# Patient Record
Sex: Female | Born: 1975
Health system: Southern US, Community
[De-identification: ages and names within clinical notes are randomized; demographics above are authoritative.]

## PROBLEM LIST (undated history)

## (undated) ENCOUNTER — Inpatient Hospital Stay (HOSPITAL_COMMUNITY): Admission: RE | Payer: Self-pay | Source: Ambulatory Visit

## (undated) DIAGNOSIS — K5900 Constipation, unspecified: Secondary | ICD-10-CM

## (undated) DIAGNOSIS — M1990 Unspecified osteoarthritis, unspecified site: Secondary | ICD-10-CM

## (undated) DIAGNOSIS — J40 Bronchitis, not specified as acute or chronic: Secondary | ICD-10-CM

## (undated) DIAGNOSIS — M549 Dorsalgia, unspecified: Secondary | ICD-10-CM

## (undated) DIAGNOSIS — R102 Pelvic and perineal pain: Secondary | ICD-10-CM

## (undated) DIAGNOSIS — Z975 Presence of (intrauterine) contraceptive device: Secondary | ICD-10-CM

## (undated) DIAGNOSIS — E271 Primary adrenocortical insufficiency: Secondary | ICD-10-CM

## (undated) DIAGNOSIS — N83201 Unspecified ovarian cyst, right side: Secondary | ICD-10-CM

## (undated) DIAGNOSIS — G43909 Migraine, unspecified, not intractable, without status migrainosus: Secondary | ICD-10-CM

## (undated) HISTORY — DX: Constipation, unspecified: K59.00

## (undated) HISTORY — PX: TUBAL LIGATION: SHX77

## (undated) HISTORY — PX: BREAST BIOPSY: SHX20

## (undated) HISTORY — DX: Primary adrenocortical insufficiency: E27.1

## (undated) HISTORY — PX: OVARIAN CYST REMOVAL: SHX89

## (undated) HISTORY — DX: Bronchitis, not specified as acute or chronic: J40

## (undated) HISTORY — PX: ECTOPIC PREGNANCY SURGERY: SHX613

## (undated) HISTORY — DX: Dorsalgia, unspecified: M54.9

## (undated) HISTORY — DX: Unspecified ovarian cyst, right side: N83.201

## (undated) HISTORY — PX: CHOLECYSTECTOMY: SHX55

## (undated) HISTORY — DX: Migraine, unspecified, not intractable, without status migrainosus: G43.909

## (undated) HISTORY — DX: Presence of (intrauterine) contraceptive device: Z97.5

## (undated) HISTORY — DX: Unspecified osteoarthritis, unspecified site: M19.90

## (undated) HISTORY — DX: Pelvic and perineal pain: R10.2

---

## 2000-02-23 ENCOUNTER — Encounter: Payer: Self-pay | Admitting: Orthopedic Surgery

## 2000-02-23 ENCOUNTER — Emergency Department (HOSPITAL_COMMUNITY): Admission: EM | Admit: 2000-02-23 | Discharge: 2000-02-23 | Payer: Self-pay | Admitting: Internal Medicine

## 2000-07-06 ENCOUNTER — Ambulatory Visit (HOSPITAL_COMMUNITY): Admission: RE | Admit: 2000-07-06 | Discharge: 2000-07-06 | Payer: Self-pay | Admitting: Neurosurgery

## 2000-07-06 ENCOUNTER — Encounter: Payer: Self-pay | Admitting: Neurosurgery

## 2000-07-19 ENCOUNTER — Emergency Department (HOSPITAL_COMMUNITY): Admission: EM | Admit: 2000-07-19 | Discharge: 2000-07-19 | Payer: Self-pay | Admitting: Internal Medicine

## 2000-08-23 ENCOUNTER — Encounter: Payer: Self-pay | Admitting: Urology

## 2000-08-23 ENCOUNTER — Encounter: Admission: RE | Admit: 2000-08-23 | Discharge: 2000-08-23 | Payer: Self-pay | Admitting: Urology

## 2001-03-19 ENCOUNTER — Inpatient Hospital Stay (HOSPITAL_COMMUNITY): Admission: AD | Admit: 2001-03-19 | Discharge: 2001-03-20 | Payer: Self-pay | Admitting: Internal Medicine

## 2001-03-25 ENCOUNTER — Encounter: Payer: Self-pay | Admitting: Internal Medicine

## 2001-03-25 ENCOUNTER — Ambulatory Visit (HOSPITAL_COMMUNITY): Admission: RE | Admit: 2001-03-25 | Discharge: 2001-03-25 | Payer: Self-pay | Admitting: Internal Medicine

## 2001-04-01 ENCOUNTER — Other Ambulatory Visit: Admission: RE | Admit: 2001-04-01 | Discharge: 2001-04-01 | Payer: Self-pay | Admitting: Obstetrics and Gynecology

## 2001-07-24 ENCOUNTER — Ambulatory Visit (HOSPITAL_COMMUNITY): Admission: RE | Admit: 2001-07-24 | Discharge: 2001-07-24 | Payer: Self-pay | Admitting: Family Medicine

## 2001-07-24 ENCOUNTER — Encounter: Payer: Self-pay | Admitting: Family Medicine

## 2001-09-09 ENCOUNTER — Emergency Department (HOSPITAL_COMMUNITY): Admission: EM | Admit: 2001-09-09 | Discharge: 2001-09-09 | Payer: Self-pay | Admitting: Emergency Medicine

## 2001-10-01 ENCOUNTER — Encounter: Payer: Self-pay | Admitting: Obstetrics and Gynecology

## 2001-10-01 ENCOUNTER — Ambulatory Visit (HOSPITAL_COMMUNITY): Admission: RE | Admit: 2001-10-01 | Discharge: 2001-10-01 | Payer: Self-pay | Admitting: Obstetrics and Gynecology

## 2001-10-24 ENCOUNTER — Ambulatory Visit (HOSPITAL_COMMUNITY): Admission: RE | Admit: 2001-10-24 | Discharge: 2001-10-24 | Payer: Self-pay | Admitting: Obstetrics and Gynecology

## 2002-11-07 ENCOUNTER — Ambulatory Visit (HOSPITAL_COMMUNITY): Admission: AD | Admit: 2002-11-07 | Discharge: 2002-11-07 | Payer: Self-pay | Admitting: Obstetrics and Gynecology

## 2002-11-17 ENCOUNTER — Ambulatory Visit (HOSPITAL_COMMUNITY): Admission: AD | Admit: 2002-11-17 | Discharge: 2002-11-18 | Payer: Self-pay | Admitting: Obstetrics and Gynecology

## 2002-11-30 ENCOUNTER — Inpatient Hospital Stay (HOSPITAL_COMMUNITY): Admission: AD | Admit: 2002-11-30 | Discharge: 2002-12-03 | Payer: Self-pay | Admitting: Obstetrics and Gynecology

## 2003-11-20 ENCOUNTER — Ambulatory Visit (HOSPITAL_COMMUNITY): Admission: RE | Admit: 2003-11-20 | Discharge: 2003-11-20 | Payer: Self-pay | Admitting: Internal Medicine

## 2004-01-13 ENCOUNTER — Ambulatory Visit: Payer: Self-pay | Admitting: Internal Medicine

## 2004-02-17 ENCOUNTER — Encounter (HOSPITAL_COMMUNITY): Admission: RE | Admit: 2004-02-17 | Discharge: 2004-03-18 | Payer: Self-pay | Admitting: Neurosurgery

## 2004-04-01 ENCOUNTER — Encounter (HOSPITAL_COMMUNITY): Admission: RE | Admit: 2004-04-01 | Discharge: 2004-05-01 | Payer: Self-pay | Admitting: Neurosurgery

## 2004-07-20 ENCOUNTER — Encounter (HOSPITAL_COMMUNITY): Admission: RE | Admit: 2004-07-20 | Discharge: 2004-08-19 | Payer: Self-pay | Admitting: Neurosurgery

## 2006-12-10 ENCOUNTER — Other Ambulatory Visit: Admission: RE | Admit: 2006-12-10 | Discharge: 2006-12-10 | Payer: Self-pay | Admitting: Obstetrics and Gynecology

## 2007-04-05 ENCOUNTER — Ambulatory Visit (HOSPITAL_COMMUNITY): Admission: RE | Admit: 2007-04-05 | Discharge: 2007-04-05 | Payer: Self-pay | Admitting: Internal Medicine

## 2007-12-16 ENCOUNTER — Other Ambulatory Visit: Admission: RE | Admit: 2007-12-16 | Discharge: 2007-12-16 | Payer: Self-pay | Admitting: Obstetrics and Gynecology

## 2007-12-22 ENCOUNTER — Emergency Department (HOSPITAL_COMMUNITY): Admission: EM | Admit: 2007-12-22 | Discharge: 2007-12-23 | Payer: Self-pay | Admitting: Emergency Medicine

## 2007-12-27 ENCOUNTER — Ambulatory Visit (HOSPITAL_COMMUNITY): Admission: RE | Admit: 2007-12-27 | Discharge: 2007-12-27 | Payer: Self-pay | Admitting: Internal Medicine

## 2008-08-11 ENCOUNTER — Encounter: Admission: RE | Admit: 2008-08-11 | Discharge: 2008-08-11 | Payer: Self-pay | Admitting: Endocrinology

## 2008-12-08 ENCOUNTER — Ambulatory Visit (HOSPITAL_COMMUNITY): Admission: RE | Admit: 2008-12-08 | Discharge: 2008-12-08 | Payer: Self-pay | Admitting: Family Medicine

## 2008-12-28 ENCOUNTER — Encounter (HOSPITAL_COMMUNITY): Admission: RE | Admit: 2008-12-28 | Discharge: 2009-01-27 | Payer: Self-pay | Admitting: Internal Medicine

## 2009-01-27 ENCOUNTER — Other Ambulatory Visit: Admission: RE | Admit: 2009-01-27 | Discharge: 2009-01-27 | Payer: Self-pay | Admitting: Obstetrics and Gynecology

## 2009-01-28 ENCOUNTER — Ambulatory Visit (HOSPITAL_COMMUNITY): Admission: RE | Admit: 2009-01-28 | Discharge: 2009-01-28 | Payer: Self-pay | Admitting: Obstetrics & Gynecology

## 2009-07-09 ENCOUNTER — Encounter: Admission: RE | Admit: 2009-07-09 | Discharge: 2009-07-09 | Payer: Self-pay | Admitting: Neurosurgery

## 2009-11-24 ENCOUNTER — Encounter: Admission: RE | Admit: 2009-11-24 | Discharge: 2009-11-24 | Payer: Self-pay | Admitting: Neurosurgery

## 2009-12-07 ENCOUNTER — Ambulatory Visit (HOSPITAL_COMMUNITY): Admission: RE | Admit: 2009-12-07 | Discharge: 2009-12-07 | Payer: Self-pay | Admitting: Cardiology

## 2009-12-07 ENCOUNTER — Encounter (INDEPENDENT_AMBULATORY_CARE_PROVIDER_SITE_OTHER): Payer: Self-pay | Admitting: Cardiology

## 2010-03-20 ENCOUNTER — Encounter: Payer: Self-pay | Admitting: Orthopedic Surgery

## 2010-05-18 ENCOUNTER — Emergency Department (HOSPITAL_COMMUNITY)
Admission: EM | Admit: 2010-05-18 | Discharge: 2010-05-18 | Disposition: A | Discharge status: Home / Self Care | Payer: Managed Care, Other (non HMO) | Attending: Emergency Medicine | Admitting: Emergency Medicine

## 2010-05-18 DIAGNOSIS — M67919 Unspecified disorder of synovium and tendon, unspecified shoulder: Secondary | ICD-10-CM | POA: Insufficient documentation

## 2010-05-18 DIAGNOSIS — M719 Bursopathy, unspecified: Secondary | ICD-10-CM | POA: Insufficient documentation

## 2010-05-18 DIAGNOSIS — M25519 Pain in unspecified shoulder: Secondary | ICD-10-CM | POA: Insufficient documentation

## 2010-07-15 NOTE — H&P (Signed)
NAME:  Gloria Acosta, Gloria Acosta NO.:  [id number]   MEDICAL RECORD NO.:  [id number]                   PATIENT TYPE:   LOCATION:                                       FACILITY:   PHYSICIAN:  Tilda Burrow, M.D.              DATE OF BIRTH:   DATE OF ADMISSION:  11/30/2002  DATE OF DISCHARGE:                                HISTORY & PHYSICAL   ADMISSION DIAGNOSIS:  Pregnancy at [redacted] weeks gestation for elective induction  scheduled for November 30, 2002.   HISTORY OF PRESENT ILLNESS:  This 35 year old female, gravida 2, para 0, AB  1 (ectopic), LMP 02/10/2002, placing menstrual EDC November 18, 2002,  with  ultrasound revised [facility] of November 30, 2002, based on a 21-week ultrasound  with a gestational sac suggesting EDC of December 08, 2002.  The patient is  39 to 40 weeks by criteria and requested induction for scheduling purposes  and for relief of chronic pressure.  The patient understands that all the  usual complications of labor can occur with induced labors.  The patient  plans for epidural to be administered early in labor, plans to breast feed  with bottle feed supplementation.   PAST MEDICAL HISTORY:  1. Anxiety.  2. Migraines.   PAST SURGICAL HISTORY:  1. History of pelvic adhesions.  2. Laparoscopic cholecystectomy in 1995.  3. Right salpingectomy by Jannifer Franklin, M.D. for hydrosalpinx in 2002.  4. Ectopic pregnancy in 1997 involving the right tube.   ALLERGIES:  PENICILLIN, SULFA, and CODEINE.   HABITS:  Cigarettes 4 per day.  Alcohol and recreational drugs denied.   SOCIAL HISTORY:  Married, employed.   PHYSICAL EXAMINATION:  GENERAL:  Cheerful, fatigued-appearing Caucasian  female, alert and oriented x e.  HEENT:  Pupils are equal, round, and reactive to light.  Extraocular  movements intact.  NECK:  Supple.  Trachea midline.  CHEST:  Clear to auscultation.  ABDOMEN:  Nontender. Gravid uterus, 40 cm.  Estimated fetal weight 8 pounds.  Cervix 1 cm, 20%, -2, and posterior with presenting part well applied to  lower uterine segment.    PRENATAL LABORATORY DATA:  Blood type O positive, rubella immune at present,  antibody screen negative.  Hemoglobin 13, hematocrit 39.  Hepatitis, HIV,  GC, Chlamydia, and RPR are all negative.  Pap smear Class I.  Glucose  tolerance 160 mg percent.   PLAN:  Balloon dilation Sunday night.  Pitocin induction in a.m. with  epidural scheduled for December 01, 2002.     ___________________________________________                                         Tilda Burrow, M.D.   JVF/MEDQ  D:  11/26/2002  T:  11/26/2002  Job:  [id number]   cc:  Francoise Schaumann. Halm, D.O.  [address], [address]  [place]  [place] [address]  Fax: [phone]

## 2010-07-15 NOTE — Discharge Summary (Signed)
[facility]  Patient:    ANAYELI, AREL Visit Number: [id number] MRN: [id number]          Service Type: OUT Location: RAD Attending Physician:  Malissa Hippo Dictated by:   Elfredia Nevins, M.D. Adm. Date:  03/19/01 Disc. Date: 03/20/01                             Discharge Summary  DISCHARGE DIAGNOSIS:  Pyelonephritis.  DISCHARGE MEDICATIONS: 1. Levaquin 500 mg p.o. q.d. 2. Tylenol p.r.n. alternating with Darvocet N100 p.o. q.i.d. p.r.n.  PAST MEDICAL HISTORY:  Pertinent for migraine headaches and status post laparoscopic cholecystectomy.  SUMMARY:  The patient was admitted on March 19, 2001, with abdominal pain, nausea, and myalgias. She was noted to have fever, chills, without true rigors. She was found to have urinary tract infection and a negative pregnancy test. She had GI symptoms that were severe enough to warrant admission, IV hydration, IV antibiotics. She responded well to interventions, followup in office. Dictated by:   Elfredia Nevins, M.D. Attending Physician:  Malissa Hippo DD:  04/27/01 TD:  04/28/01 Job: [id number]

## 2010-07-15 NOTE — Discharge Summary (Signed)
   NAME:  Gloria Acosta, Gloria Acosta                         ACCOUNT NO.:  [id number]   MEDICAL RECORD NO.:  [id number]                   PATIENT TYPE:  INP   LOCATION:  A418                                 FACILITY:  [facility]   PHYSICIAN:  Tilda Burrow, M.D.              DATE OF BIRTH:  April 12, 1975   DATE OF ADMISSION:  11/30/2002  DATE OF DISCHARGE:  12/03/2002                                 DISCHARGE SUMMARY   ADDITIONAL DIAGNOSES:  Pregnancy at 39 weeks, elective induction.   DISCHARGE DIAGNOSES:  Pregnancy at 39 weeks, delivered, failed medical  induction, cephalopelvic disproportion.   PROCEDURE:  1. Primary low transverse cervical cesarean section on December 01, 2002.  2. Lumbar epidural catheter, Tilda Burrow, M.D.   DISCHARGE MEDICATIONS:  1. Tylox 1-2 q.4h. p.r.n. pain, dispensed 20.  2. Motrin 200 mg two q.4h. p.r.n. pain.  3. Mylicon p.r.n. gas.   FOLLOWUP:  Follow up in five days for staple removal.   HOSPITAL SUMMARY:  This 35 year old female gravida 2, para 0 is admitted for  induction at 39 weeks after a pregnancy course notable for estimated fetal  weight of 8 pounds with cervix 1 cm, 20%, -2.  Very posterior with well  applied low uterine segment.  The patient requested elective induction and  accepted the usual risks associated with labor management.   HOSPITAL COURSE:  The patient was admitted on the evening of November 30, 2002, underwent balloon dilation of the cervix overnight which allowed the  cervix to go to 1-2 cm vertex, -2 station.  She received lumbar epidural.  She proceeded with epidural analgesia which worked with excellent relief.  She had arrestive labor at 4 cm, 90%, -1 with a very molded vertex and a  prominent ischial spine that obstructed the progress of the vertex.  Cephalopelvic disproportion was felt to be diagnosed.  The patient went to  cesarean section delivering a 9 pound 9 ounce female with Apgars of 9 and 9,  cared for by Dr. Milford Cage.   Postpartum hemoglobin 9.4, hematocrit 27.8 compared to admitting hemoglobin  11.1, hematocrit 33.0.  Maternal blood type confirmed as O positive.   Mother will be followed up in five days for staple removal.  She plans to  bottle feed.  May reattempt breast feeding, is not certain.  She will be  discharged today after efforts at being taught to pump.  Follow up in five  days then four weeks.     ___________________________________________                                         Tilda Burrow, M.D.   JVF/MEDQ  D:  12/03/2002  T:  12/03/2002  Job:  [id number]

## 2010-07-15 NOTE — H&P (Signed)
NAME:  Gloria Acosta, Gloria Acosta                         ACCOUNT NO.:  [id number]   MEDICAL RECORD NO.:  [id number]                   PATIENT TYPE:  AMB   LOCATION:  DAY                                  FACILITY:  [facility]   PHYSICIAN:  Tilda Burrow, M.D.              DATE OF BIRTH:  31-Jul-1975   DATE OF ADMISSION:  DATE OF DISCHARGE:                                HISTORY & PHYSICAL   ADMITTING DIAGNOSES:  1. Infertility  2. Right hydrosalpinx.   HISTORY OF PRESENT ILLNESS:  This 35 year old female, gravida 1, para 0,  ectopic 1, status post right linear salpingostomy, is admitted at this time  for right salpingectomy.  The patient has been followed through our office  and in the year 2000, had a laparoscopic evaluation for pelvic pain prior to  beginning fertility workup.  Diagnostic laparoscopy revealed a few thin  adhesions with the uterus slightly asymmetric, deviated to the patient's  right side, with two small dense adhesions from the ovary to the right  sidewall and a right hydrosalpinx present with disruption of the distal one-  third of the right tube, status post previous ectopic surgery.  The patient  has since then been seen in our office for a fertility evaluation and has  had recent hysterosalpingogram which confirms that the attempts at  neosalpingostomy at the time of that 2001 surgery failed.  The presence of  hydrosalpinx continues.  Current discussion of case with Dr. Gaetano Hawthorne.  Lily Peer as well as Dr. Lazaro Arms indicates that current fertility  thinking is that chronic hydrosalpinx associated with a patent opposite tube  warrants removal of the tube.  The patient has had this explained and  desires to proceed with salpingectomy.  The patient was made completely  aware that there are no guarantees that subsequent fertility with the  remaining tube can be given.  Further documentation is planned to assist  with any subsequent reproductive techniques or referral.   PAST MEDICAL HISTORY:  Benign.   SURGICAL HISTORY:  Cholecystectomy in 1994, laparoscopically performed;  right linear salpingostomy in 1997 with lysis of pelvic adhesions, at which  time the left tube was found to be patent but phimosed; laparoscopic  evaluation for pelvic pain in 2001 revealing the previously mentioned  fimbria visible at the tip of the left tube; recent hysterosalpingogram  showing patency and peritoneal spill through the left tube.   ALLERGIES:  PENICILLIN AND SULFA.   SOCIAL HISTORY:  Works at [employer].   REVIEW OF SYSTEMS:  Review of systems is positive for recent sinus  congestion and mild fever.   PHYSICAL EXAMINATION:  VITAL SIGNS:  Height 5 feet 6 inches.  Weight is 142.  Blood pressure 130/80.   LABORATORY DATA:  Urinalysis shows 2+ blood and urine culture has returned  showing no evidence of nitrites or esterase, with only 10,000 Enterococci  species.   ASSESSMENT:  1. Right hydrosalpinx.  2. Primary infertility.   PLAN:  Laparoscopic right salpingectomy with further documentation of  patency of the left tube by tubal dye studies.                                                 Tilda Burrow, M.D.    JVF/MEDQ  D:  10/23/2001  T:  10/24/2001  Job:  [id number]

## 2010-07-15 NOTE — Op Note (Signed)
NAME:  Gloria Acosta, Gloria Acosta                         ACCOUNT NO.:  [id number]   MEDICAL RECORD NO.:  [id number]                   PATIENT TYPE:  INP   LOCATION:  A418                                 FACILITY:  [facility]   PHYSICIAN:  Tilda Burrow, M.D.              DATE OF BIRTH:  [birth year]   DATE OF PROCEDURE:  DATE OF DISCHARGE:                                 OPERATIVE REPORT   PREOPERATIVE DIAGNOSES:  Pregnancy 39 weeks, failed medical induction,  cephalopelvic disproportion.   POSTOPERATIVE DIAGNOSES:  Pregnancy 39 weeks, failed medical induction,  cephalopelvic disproportion.   PROCEDURE:  Primary low transverse cervical cesarean section.   SURGEON:  Tilda Burrow, M.D.   ASSISTANT:  Zerita Boers, C.N.M.   ANESTHESIA:  Epidural, Tilda Burrow, M.D. managed by Minerva Areola, CRNA.   COMPLICATIONS:  None.   FINDINGS:  Large framed 9 pound 9 ounce female infant, significant molding  of the vertex, no chance at vaginal delivery.   DESCRIPTION OF PROCEDURE:  The patient was taken to the operating room,  prepped and draped for lower abdominal surgery after epidural had been  topped up by Minerva Areola, CRNA.  The patient had excellent analgesic levels  and a Pfannenstiel type incision was performed approximately 12 cm in  length, with easy access through a thin fatty layer to the fascia which was  opened transversely and the standard Pfannenstiel technique used in opening  it. A bladder flap was developed on the lower uterine segment and then an  easy transverse incision made in the lower uterine segment at the level of  the infant's ear. The vertex could be gently dislodged from the pelvis where  it was snuggly stuck and then it was rotated into the incision. A vacuum  extractor was used to guide the vertex when combined with fundal pressure.  The infant's right arm was released first to reduce shoulder girdle size for  delivery and then the infant eased out from there.  The cord was clamped and  the infant taken by Ms. Hart Rochester to Dr. Milford Cage for further care, see his notes  for details. Apgar of 9&9 were assigned and weighted subsequently determined  to be 9 pounds and 9 ounces.   Placenta delivered [facility] presentation after cord blood samples obtained  with the results located elsewhere in the chart.   A single layer of running locking closure of the uterus was performed with  good hemostasis except at the left corner where a small hematoma was  developing from the uterine vessels. A single suture was necessary around  the uterine artery and vein on the left side to improve hemostasis. We then  proceeded with bladder flap closure and 2-0 Chromic. We then proceeded with  irrigation of the abdomen, 2-0 Chromic closure of the anterior peritoneum, 0  Chromic closure of the fascial layer. A 2-0 plain reapproximation of the  subcu fatty tissue  with three interrupted sutures and then staple closure of  the skin for completion of the procedure. The patient tolerated the  procedure well and went to the recovery room in good condition with 500 mL  blood loss.      ___________________________________________                                            Tilda Burrow, M.D.   JVF/MEDQ  D:  12/01/2002  T:  12/02/2002  Job:  [id number]

## 2010-07-15 NOTE — Op Note (Signed)
NAME:  Gloria Acosta, Gloria Acosta                         ACCOUNT NO.:  [id number]   MEDICAL RECORD NO.:  [id number]                   PATIENT TYPE:  AMB   LOCATION:  DAY                                  FACILITY:  [facility]   PHYSICIAN:  Tilda Burrow, M.D.              DATE OF BIRTH:  12-Feb-1976   DATE OF PROCEDURE:  10/24/2001  DATE OF DISCHARGE:  10/24/2001                                 OPERATIVE REPORT   PREOPERATIVE DIAGNOSIS:  Right hydrosalpinx, infertility.   POSTOPERATIVE DIAGNOSIS:  Right hydrosalpinx, infertility.   PROCEDURE:  Laparoscopic right salpingectomy.   SURGEON:  Tilda Burrow, M.D.   ASSISTANTCurley Spice   ANESTHESIA:  General endotracheal intubation   COMPLICATIONS:  None.   ESTIMATED BLOOD LOSS:  Minimal.   INDICATIONS:  This 35 year old female who has had a documented hydrosalpinx  in the past -- see HPI for details.   DESCRIPTION OF PROCEDURE:  The patient was prepped and draped for combined  abdominal and vaginal procedure with legs in lithotomy position using yellow-  fin leg supports.  Hulka tenaculum was attached to the cervix for uterine  manipulation and Foley catheter placed in the bladder.  The infraumbilical  vertical 1 cm skin incision with a transverse suprapubic 1 cm incision were  used to achieve pneumoperitoneum with Veress introduced through the  umbilicus with insufflation with 3 liters of CO2.  Patient had laparoscopic  trocar introduced into the umbilicus.  Visualization of the pelvic  structures showed no evidence of bleeding or trauma, and suprapubic trocar  introduced under direct visualization.   Attention was then directed to the pelvis whereupon the third trocar site  could be introduced into the right lower quadrant.  We then were able to  pick up the tube of the dilated hydrosalpinx tube and with a grasping  Babcock clamp, and the hydrosalpinx was then excised by using unipolar  cautery excising the tube from its attachment to  the uterus on one side, and  then cauterizing across just beneath the tube removing the entire tube  without difficulty.  The ovary was left alone.  Hemostasis was excellent.  The patient tolerated the procedure well.  Inspection of the pelvis was  performed and no additional adhesions encountered.  The opposite side  appeared to be in good shape. We then  proceeded with deflation of the abdomen instilling 500 cc of normal saline  into the abdomen, removal of laparoscopic equipment, and subcuticular 4-0  Dexon closure of the skin incisions after closure of the fascial incision  with #0 Vicryl.  The patient tolerated the procedure well and went to  recovery room in good condition.  Tilda Burrow, M.D.    JVF/MEDQ  D:  12/15/2001  T:  12/15/2001  Job:  [id number]

## 2010-07-18 ENCOUNTER — Other Ambulatory Visit: Payer: Self-pay | Admitting: Neurosurgery

## 2010-07-18 ENCOUNTER — Ambulatory Visit (HOSPITAL_COMMUNITY)
Admission: RE | Admit: 2010-07-18 | Discharge: 2010-07-18 | Disposition: A | Discharge status: Home / Self Care | Payer: Managed Care, Other (non HMO) | Source: Ambulatory Visit | Attending: Family Medicine | Admitting: Family Medicine

## 2010-07-18 ENCOUNTER — Other Ambulatory Visit (HOSPITAL_COMMUNITY): Payer: Self-pay | Admitting: Family Medicine

## 2010-07-18 DIAGNOSIS — M25579 Pain in unspecified ankle and joints of unspecified foot: Secondary | ICD-10-CM | POA: Insufficient documentation

## 2010-07-18 DIAGNOSIS — X58XXXA Exposure to other specified factors, initial encounter: Secondary | ICD-10-CM | POA: Insufficient documentation

## 2010-07-18 DIAGNOSIS — S8990XA Unspecified injury of unspecified lower leg, initial encounter: Secondary | ICD-10-CM | POA: Insufficient documentation

## 2010-07-18 DIAGNOSIS — M47816 Spondylosis without myelopathy or radiculopathy, lumbar region: Secondary | ICD-10-CM

## 2010-07-29 ENCOUNTER — Other Ambulatory Visit: Payer: Managed Care, Other (non HMO)

## 2010-08-16 ENCOUNTER — Other Ambulatory Visit: Payer: Self-pay | Admitting: Neurosurgery

## 2010-08-16 DIAGNOSIS — M47816 Spondylosis without myelopathy or radiculopathy, lumbar region: Secondary | ICD-10-CM

## 2010-08-19 ENCOUNTER — Ambulatory Visit
Admission: RE | Admit: 2010-08-19 | Discharge: 2010-08-19 | Disposition: A | Discharge status: Home / Self Care | Payer: Managed Care, Other (non HMO) | Source: Ambulatory Visit | Attending: Neurosurgery | Admitting: Neurosurgery

## 2010-08-19 DIAGNOSIS — M47816 Spondylosis without myelopathy or radiculopathy, lumbar region: Secondary | ICD-10-CM

## 2010-09-30 ENCOUNTER — Other Ambulatory Visit: Payer: Self-pay | Admitting: Neurosurgery

## 2010-09-30 DIAGNOSIS — M47816 Spondylosis without myelopathy or radiculopathy, lumbar region: Secondary | ICD-10-CM

## 2010-10-07 ENCOUNTER — Ambulatory Visit
Admission: RE | Admit: 2010-10-07 | Discharge: 2010-10-07 | Disposition: A | Discharge status: Home / Self Care | Payer: Managed Care, Other (non HMO) | Source: Ambulatory Visit | Attending: Neurosurgery | Admitting: Neurosurgery

## 2010-10-07 VITALS — BP 113/71 | HR 77

## 2010-10-07 DIAGNOSIS — M47816 Spondylosis without myelopathy or radiculopathy, lumbar region: Secondary | ICD-10-CM

## 2010-10-07 MED ORDER — METHYLPREDNISOLONE ACETATE 40 MG/ML INJ SUSP (RADIOLOG
120.0000 mg | Freq: Once | INTRAMUSCULAR | Status: AC
  Administered 2010-10-07: 120 mg via EPIDURAL

## 2010-10-07 MED ORDER — IOHEXOL 180 MG/ML  SOLN
1.0000 mL | Freq: Once | INTRAMUSCULAR | Status: AC | PRN
  Administered 2010-10-07: 1 mL via EPIDURAL

## 2010-11-08 ENCOUNTER — Other Ambulatory Visit (HOSPITAL_COMMUNITY): Payer: Self-pay | Admitting: Internal Medicine

## 2010-11-08 DIAGNOSIS — Z139 Encounter for screening, unspecified: Secondary | ICD-10-CM

## 2010-11-10 ENCOUNTER — Ambulatory Visit (HOSPITAL_COMMUNITY)
Admission: RE | Admit: 2010-11-10 | Discharge: 2010-11-10 | Disposition: A | Discharge status: Home / Self Care | Payer: Managed Care, Other (non HMO) | Source: Ambulatory Visit | Attending: Internal Medicine | Admitting: Internal Medicine

## 2010-11-10 DIAGNOSIS — Z139 Encounter for screening, unspecified: Secondary | ICD-10-CM

## 2010-11-10 DIAGNOSIS — Z1231 Encounter for screening mammogram for malignant neoplasm of breast: Secondary | ICD-10-CM | POA: Insufficient documentation

## 2010-11-15 ENCOUNTER — Other Ambulatory Visit: Payer: Self-pay | Admitting: Neurosurgery

## 2010-11-15 ENCOUNTER — Other Ambulatory Visit (HOSPITAL_COMMUNITY): Payer: Self-pay | Admitting: Neurosurgery

## 2010-11-15 DIAGNOSIS — IMO0002 Reserved for concepts with insufficient information to code with codable children: Secondary | ICD-10-CM

## 2010-11-15 DIAGNOSIS — M519 Unspecified thoracic, thoracolumbar and lumbosacral intervertebral disc disorder: Secondary | ICD-10-CM

## 2010-11-15 DIAGNOSIS — M47816 Spondylosis without myelopathy or radiculopathy, lumbar region: Secondary | ICD-10-CM

## 2010-11-18 ENCOUNTER — Ambulatory Visit (HOSPITAL_COMMUNITY)
Admission: RE | Admit: 2010-11-18 | Discharge: 2010-11-18 | Disposition: A | Discharge status: Home / Self Care | Payer: Managed Care, Other (non HMO) | Source: Ambulatory Visit | Attending: Neurosurgery | Admitting: Neurosurgery

## 2010-11-18 DIAGNOSIS — M51379 Other intervertebral disc degeneration, lumbosacral region without mention of lumbar back pain or lower extremity pain: Secondary | ICD-10-CM | POA: Insufficient documentation

## 2010-11-18 DIAGNOSIS — M545 Low back pain, unspecified: Secondary | ICD-10-CM | POA: Insufficient documentation

## 2010-11-18 DIAGNOSIS — M79609 Pain in unspecified limb: Secondary | ICD-10-CM | POA: Insufficient documentation

## 2010-11-18 DIAGNOSIS — R209 Unspecified disturbances of skin sensation: Secondary | ICD-10-CM | POA: Insufficient documentation

## 2010-11-18 DIAGNOSIS — M5137 Other intervertebral disc degeneration, lumbosacral region: Secondary | ICD-10-CM | POA: Insufficient documentation

## 2010-11-18 DIAGNOSIS — IMO0002 Reserved for concepts with insufficient information to code with codable children: Secondary | ICD-10-CM

## 2011-02-13 ENCOUNTER — Other Ambulatory Visit (HOSPITAL_COMMUNITY)
Admission: RE | Admit: 2011-02-13 | Discharge: 2011-02-13 | Disposition: A | Discharge status: Home / Self Care | Payer: Managed Care, Other (non HMO) | Source: Ambulatory Visit | Attending: Obstetrics and Gynecology | Admitting: Obstetrics and Gynecology

## 2011-02-13 ENCOUNTER — Other Ambulatory Visit: Payer: Self-pay | Admitting: Adult Health

## 2011-02-13 DIAGNOSIS — Z01419 Encounter for gynecological examination (general) (routine) without abnormal findings: Secondary | ICD-10-CM | POA: Insufficient documentation

## 2011-11-13 ENCOUNTER — Other Ambulatory Visit (HOSPITAL_COMMUNITY): Payer: Self-pay | Admitting: Internal Medicine

## 2011-11-13 DIAGNOSIS — Z139 Encounter for screening, unspecified: Secondary | ICD-10-CM

## 2011-11-20 ENCOUNTER — Ambulatory Visit (HOSPITAL_COMMUNITY)
Admission: RE | Admit: 2011-11-20 | Discharge: 2011-11-20 | Disposition: A | Discharge status: Home / Self Care | Payer: BC Managed Care – PPO | Source: Ambulatory Visit | Attending: Internal Medicine | Admitting: Internal Medicine

## 2011-11-20 DIAGNOSIS — Z1231 Encounter for screening mammogram for malignant neoplasm of breast: Secondary | ICD-10-CM | POA: Insufficient documentation

## 2011-11-20 DIAGNOSIS — Z139 Encounter for screening, unspecified: Secondary | ICD-10-CM

## 2012-03-11 ENCOUNTER — Other Ambulatory Visit: Payer: Self-pay | Admitting: Adult Health

## 2012-03-11 ENCOUNTER — Other Ambulatory Visit (HOSPITAL_COMMUNITY)
Admission: RE | Admit: 2012-03-11 | Discharge: 2012-03-11 | Disposition: A | Discharge status: Home / Self Care | Payer: BC Managed Care – PPO | Source: Ambulatory Visit | Attending: Obstetrics and Gynecology | Admitting: Obstetrics and Gynecology

## 2012-03-11 DIAGNOSIS — Z01419 Encounter for gynecological examination (general) (routine) without abnormal findings: Secondary | ICD-10-CM | POA: Insufficient documentation

## 2012-03-11 DIAGNOSIS — Z113 Encounter for screening for infections with a predominantly sexual mode of transmission: Secondary | ICD-10-CM | POA: Insufficient documentation

## 2012-03-11 DIAGNOSIS — N63 Unspecified lump in unspecified breast: Secondary | ICD-10-CM

## 2012-03-11 DIAGNOSIS — Z1151 Encounter for screening for human papillomavirus (HPV): Secondary | ICD-10-CM | POA: Insufficient documentation

## 2012-03-27 ENCOUNTER — Ambulatory Visit (HOSPITAL_COMMUNITY)
Admission: RE | Admit: 2012-03-27 | Discharge: 2012-03-27 | Disposition: A | Discharge status: Home / Self Care | Payer: BC Managed Care – PPO | Source: Ambulatory Visit | Attending: Adult Health | Admitting: Adult Health

## 2012-03-27 ENCOUNTER — Other Ambulatory Visit (HOSPITAL_COMMUNITY): Payer: Self-pay | Admitting: Adult Health

## 2012-03-27 DIAGNOSIS — N63 Unspecified lump in unspecified breast: Secondary | ICD-10-CM

## 2012-03-27 DIAGNOSIS — R928 Other abnormal and inconclusive findings on diagnostic imaging of breast: Secondary | ICD-10-CM | POA: Insufficient documentation

## 2013-03-18 ENCOUNTER — Ambulatory Visit (INDEPENDENT_AMBULATORY_CARE_PROVIDER_SITE_OTHER): Payer: BC Managed Care – PPO | Admitting: Adult Health

## 2013-03-18 ENCOUNTER — Encounter: Payer: Self-pay | Admitting: Adult Health

## 2013-03-18 ENCOUNTER — Encounter (INDEPENDENT_AMBULATORY_CARE_PROVIDER_SITE_OTHER): Payer: Self-pay

## 2013-03-18 VITALS — BP 120/76 | HR 74 | Ht 66.0 in | Wt 142.0 lb

## 2013-03-18 DIAGNOSIS — Z975 Presence of (intrauterine) contraceptive device: Secondary | ICD-10-CM | POA: Insufficient documentation

## 2013-03-18 DIAGNOSIS — Z01419 Encounter for gynecological examination (general) (routine) without abnormal findings: Secondary | ICD-10-CM

## 2013-03-18 HISTORY — DX: Presence of (intrauterine) contraceptive device: Z97.5

## 2013-03-18 NOTE — Progress Notes (Signed)
Patient ID: Gloria Acosta, female   DOB: [birth year], 38 y.o.   MRN: [id number] History of Present Illness: Delania is a 38 year old white female in for a physical.She had a normal pap with negative HPV.   Current Medications, Allergies, Past Medical History, Past Surgical History, Family History and Social History were reviewed in [employer] record.     Review of Systems: Patient denies any headaches, blurred vision, shortness of breath, chest pain, abdominal pain, problems with bowel movements, urination, or intercourse. Has chronic back pain, no mood swings.Has IUD.    Physical Exam:BP 120/76  Pulse 74  Ht 5\' 6"  (1.676 m)  Wt 142 lb (64.411 kg)  BMI 22.93 kg/m2 General:  Well developed, well nourished, no acute distress Skin:  Warm and dry Neck:  Midline trachea, normal thyroid Lungs; Clear to auscultation bilaterally Breast:  No dominant palpable mass, retraction, or nipple discharge Cardiovascular: Regular rate and rhythm Abdomen:  Soft, non tender, no hepatosplenomegaly Pelvic:  External genitalia is normal in appearance.  The vagina is normal in appearance. The cervix is smooth no IUD strings seen, but IUD in place on [place].  Uterus is felt to be normal size, shape, and contour.  No  adnexal masses or tenderness noted Extremities:  No swelling or varicosities noted Psych:  No mood changes, alert and cooperative, seems happy   Impression: Yearly gyn exam no pap IUD in place    Plan: Physical in 1 year Mammogram at 39  Labs at PCP

## 2013-03-18 NOTE — Patient Instructions (Signed)
Physical in 1 year Mammogram at 2  Labs with PCP

## 2013-06-04 ENCOUNTER — Encounter (HOSPITAL_COMMUNITY): Payer: BC Managed Care – PPO

## 2013-06-12 ENCOUNTER — Other Ambulatory Visit (HOSPITAL_COMMUNITY): Payer: Self-pay

## 2013-06-12 ENCOUNTER — Other Ambulatory Visit (HOSPITAL_COMMUNITY): Payer: Self-pay | Admitting: *Deleted

## 2013-06-13 ENCOUNTER — Ambulatory Visit (HOSPITAL_COMMUNITY)
Admission: RE | Admit: 2013-06-13 | Discharge: 2013-06-13 | Disposition: A | Discharge status: Home / Self Care | Payer: BC Managed Care – PPO | Source: Ambulatory Visit | Attending: "Endocrinology | Admitting: "Endocrinology

## 2013-06-13 DIAGNOSIS — Z5181 Encounter for therapeutic drug level monitoring: Secondary | ICD-10-CM | POA: Insufficient documentation

## 2013-06-13 MED ORDER — SODIUM CHLORIDE 0.9 % IV SOLN
INTRAVENOUS | Status: DC
  Administered 2013-06-13: 10:00:00 via INTRAVENOUS

## 2013-06-13 MED ORDER — COSYNTROPIN 0.25 MG IJ SOLR
0.2500 mg | Freq: Once | INTRAMUSCULAR | Status: AC
  Administered 2013-06-13: 0.25 mg via INTRAVENOUS
  Filled 2013-06-13: qty 0.25

## 2013-06-17 LAB — ACTH STIMULATION, 3 TIME POINTS
CORTISOL 30 MIN: 15.6 ug/dL — AB (ref >20.0)
CORTISOL 60 MIN: 20.3 ug/dL (ref >20)
Cortisol, Base: 4.8 ug/dL

## 2013-09-16 ENCOUNTER — Other Ambulatory Visit (HOSPITAL_COMMUNITY): Payer: Self-pay | Admitting: Internal Medicine

## 2013-09-16 DIAGNOSIS — N390 Urinary tract infection, site not specified: Secondary | ICD-10-CM

## 2013-09-16 DIAGNOSIS — R3129 Other microscopic hematuria: Secondary | ICD-10-CM

## 2013-09-18 ENCOUNTER — Ambulatory Visit (HOSPITAL_COMMUNITY): Admission: RE | Admit: 2013-09-18 | Payer: BC Managed Care – PPO | Source: Ambulatory Visit

## 2013-12-29 ENCOUNTER — Encounter: Payer: Self-pay | Admitting: Adult Health

## 2014-03-03 ENCOUNTER — Other Ambulatory Visit (HOSPITAL_COMMUNITY): Payer: Self-pay | Admitting: Internal Medicine

## 2014-03-03 DIAGNOSIS — R102 Pelvic and perineal pain: Secondary | ICD-10-CM

## 2014-03-03 DIAGNOSIS — R319 Hematuria, unspecified: Secondary | ICD-10-CM

## 2014-03-03 DIAGNOSIS — R109 Unspecified abdominal pain: Secondary | ICD-10-CM

## 2014-03-05 ENCOUNTER — Ambulatory Visit (HOSPITAL_COMMUNITY): Payer: 59

## 2014-07-29 ENCOUNTER — Encounter: Payer: Self-pay | Admitting: Adult Health

## 2014-07-29 ENCOUNTER — Ambulatory Visit (INDEPENDENT_AMBULATORY_CARE_PROVIDER_SITE_OTHER): Payer: 59 | Admitting: Adult Health

## 2014-07-29 VITALS — BP 118/70 | HR 84 | Ht 64.75 in | Wt 150.0 lb

## 2014-07-29 DIAGNOSIS — Z01419 Encounter for gynecological examination (general) (routine) without abnormal findings: Secondary | ICD-10-CM

## 2014-07-29 DIAGNOSIS — Z1212 Encounter for screening for malignant neoplasm of rectum: Secondary | ICD-10-CM

## 2014-07-29 DIAGNOSIS — R102 Pelvic and perineal pain: Secondary | ICD-10-CM

## 2014-07-29 DIAGNOSIS — N83201 Unspecified ovarian cyst, right side: Secondary | ICD-10-CM | POA: Insufficient documentation

## 2014-07-29 DIAGNOSIS — Z975 Presence of (intrauterine) contraceptive device: Secondary | ICD-10-CM

## 2014-07-29 DIAGNOSIS — K5900 Constipation, unspecified: Secondary | ICD-10-CM

## 2014-07-29 HISTORY — DX: Constipation, unspecified: K59.00

## 2014-07-29 HISTORY — DX: Pelvic and perineal pain: R10.2

## 2014-07-29 HISTORY — DX: Unspecified ovarian cyst, right side: N83.201

## 2014-07-29 LAB — HEMOCCULT GUIAC POC 1CARD (OFFICE): Fecal Occult Blood, POC: NEGATIVE

## 2014-07-29 MED ORDER — LINACLOTIDE 145 MCG PO CAPS: 145.0000 ug | ORAL_CAPSULE | Freq: Every day | ORAL | Status: DC

## 2014-07-29 NOTE — Patient Instructions (Signed)
Ovarian Cyst An ovarian cyst is a fluid-filled sac that forms on an ovary. The ovaries are small organs that produce eggs in women. Various types of cysts can form on the ovaries. Most are not cancerous. Many do not cause problems, and they often go away on their own. Some may cause symptoms and require treatment. Common types of ovarian cysts include:  Functional cysts--These cysts may occur every month during the menstrual cycle. This is normal. The cysts usually go away with the next menstrual cycle if the woman does not get pregnant. Usually, there are no symptoms with a functional cyst.  Endometrioma cysts--These cysts form from the tissue that lines the uterus. They are also called "chocolate cysts" because they become filled with blood that turns brown. This type of cyst can cause pain in the lower abdomen during intercourse and with your menstrual period.  Cystadenoma cysts--This type develops from the cells on the outside of the ovary. These cysts can get very big and cause lower abdomen pain and pain with intercourse. This type of cyst can twist on itself, cut off its blood supply, and cause severe pain. It can also easily rupture and cause a lot of pain.  Dermoid cysts--This type of cyst is sometimes found in both ovaries. These cysts may contain different kinds of body tissue, such as skin, teeth, hair, or cartilage. They usually do not cause symptoms unless they get very big.  Theca lutein cysts--These cysts occur when too much of a certain hormone (human chorionic gonadotropin) is produced and overstimulates the ovaries to produce an egg. This is most common after procedures used to assist with the conception of a baby (in vitro fertilization). CAUSES   Fertility drugs can cause a condition in which multiple large cysts are formed on the ovaries. This is called ovarian hyperstimulation syndrome.  A condition called polycystic ovary syndrome can cause hormonal imbalances that can lead to  nonfunctional ovarian cysts. SIGNS AND SYMPTOMS  Many ovarian cysts do not cause symptoms. If symptoms are present, they may include:  Pelvic pain or pressure.  Pain in the lower abdomen.  Pain during sexual intercourse.  Increasing girth (swelling) of the abdomen.  Abnormal menstrual periods.  Increasing pain with menstrual periods.  Stopping having menstrual periods without being pregnant. DIAGNOSIS  These cysts are commonly found during a routine or annual pelvic exam. Tests may be ordered to find out more about the cyst. These tests may include:  Ultrasound.  X-ray of the pelvis.  CT scan.  MRI.  Blood tests. TREATMENT  Many ovarian cysts go away on their own without treatment. Your health care provider may want to check your cyst regularly for 2-3 months to see if it changes. For women in menopause, it is particularly important to monitor a cyst closely because of the higher rate of ovarian cancer in menopausal women. When treatment is needed, it may include any of the following:  A procedure to drain the cyst (aspiration). This may be done using a long needle and ultrasound. It can also be done through a laparoscopic procedure. This involves using a thin, lighted tube with a tiny camera on the end (laparoscope) inserted through a small incision.  Surgery to remove the whole cyst. This may be done using laparoscopic surgery or an open surgery involving a larger incision in the lower abdomen.  Hormone treatment or birth control pills. These methods are sometimes used to help dissolve a cyst. HOME CARE INSTRUCTIONS   Only take over-the-counter   or prescription medicines as directed by your health care provider.  Follow up with your health care provider as directed.  Get regular pelvic exams and Pap tests. SEEK MEDICAL CARE IF:   Your periods are late, irregular, or painful, or they stop.  Your pelvic pain or abdominal pain does not go away.  Your abdomen becomes  larger or swollen.  You have pressure on your bladder or trouble emptying your bladder completely.  You have pain during sexual intercourse.  You have feelings of fullness, pressure, or discomfort in your stomach.  You lose weight for no apparent reason.  You feel generally ill.  You become constipated.  You lose your appetite.  You develop acne.  You have an increase in body and facial hair.  You are gaining weight, without changing your exercise and eating habits.  You think you are pregnant. SEEK IMMEDIATE MEDICAL CARE IF:   You have increasing abdominal pain.  You feel sick to your stomach (nauseous), and you throw up (vomit).  You develop a fever that comes on suddenly.  You have abdominal pain during a bowel movement.  Your menstrual periods become heavier than usual. MAKE SURE YOU:  Understand these instructions.  Will watch your condition.  Will get help right away if you are not doing well or get worse. Document Released: 02/13/2005 Document Revised: 02/18/2013 Document Reviewed: 10/21/2012 [facility] Patient Information 2015 [place], [place]. This information is not intended to replace advice given to you by your health care provider. Make sure you discuss any questions you have with your health care provider. Get mammogram Return in 4 weeks for [place] will talk when labs back

## 2014-07-29 NOTE — Progress Notes (Addendum)
Patient ID: Gloria Acosta, female   DOB: 07/12/1975, 39 y.o.   MRN: [id number] History of Present Illness: Gloria Acosta is a 39 year old white female in for a well woman gyn exam.She had a normal pap with negative HPV 02/2012.She is complaining of sharp pain in vagina area and low pelvis, she has an IUD, and it is probably time to remove it.She has had long standing constipation,uses OTC laxative or stool softener,she says she goes every 3-4 days and it is hard. PCP is Dr Gerarda Fraction, who follows her Addison's.  Current Medications, Allergies, Past Medical History, Past Surgical History, Family History and Social History were reviewed in [employer] record.     Review of Systems: Patient denies any headaches, hearing loss, fatigue, blurred vision, shortness of breath, chest pain,  problems with  urination, or intercourse. No joint pain or mood swings. See HPI for positives.    Physical Exam:BP 118/70 mmHg  Pulse 84  Ht 5' 4.75" (1.645 m)  Wt 150 lb (68.04 kg)  BMI 25.14 kg/m2 General:  Well developed, well nourished, no acute distress Skin:  Warm and dry Neck:  Midline trachea, normal thyroid, good ROM, no lymphadenopathy Lungs; Clear to auscultation bilaterally Breast:  No dominant palpable mass, retraction, or nipple discharge,but has regular bilateral irregularities  Cardiovascular: Regular rate and rhythm Abdomen:  Soft, non tender, no hepatosplenomegaly Pelvic:  External genitalia is normal in appearance, no lesions.  The vagina is normal in appearance. Urethra has no lesions or masses. The cervix is bulbous and smooth, no strings seen,but US shows IUD in place and 4 x 4 x 3 cm cyst on right ovary(this was done as [place]).  Uterus is felt to be normal size, shape, and contour.  No adnexal masses or tenderness noted.Bladder is non tender, no masses felt. Rectal: Good sphincter tone, no polyps, internal hemorrhoids felt.  Hemoccult negative. Extremities/musculoskeletal:  No  swelling or varicosities noted, no clubbing or cyanosis Psych:  No mood changes, alert and cooperative,seems happy She said her MGM died with ovarian cancer.Discussed getting CA 125 and if negative a F/U US in 4 weeks, discussed tubal and ablation and IUD removal.  Impression: Well woman gyn exam no pap Pelvic pain Right ovarian cyst IUD in place Constipation     Plan: Check CBC,CMP,TSH and lipids and CA 125 Return in 4 weeks for gyn [place] Get mammogram now Pap and physical in 1 year Rx linzess 145 mcg #30 1 daily with 6 refills Review handout on ovarian cyst and ablation

## 2014-07-30 LAB — LIPID PANEL
Chol/HDL Ratio: 3.3 ratio units (ref 0.0–4.4)
Cholesterol, Total: 201 mg/dL — ABNORMAL HIGH (ref 100–199)
HDL: 61 mg/dL (ref >39)
LDL CALC: 129 mg/dL — AB (ref 0–99)
Triglycerides: 57 mg/dL (ref 0–149)
VLDL CHOLESTEROL CAL: 11 mg/dL (ref 5–40)

## 2014-07-30 LAB — COMPREHENSIVE METABOLIC PANEL
A/G RATIO: 1.5 (ref 1.1–2.5)
ALK PHOS: 51 IU/L (ref 39–117)
ALT: 14 IU/L (ref 0–32)
AST: 14 IU/L (ref 0–40)
Albumin: 4.3 g/dL (ref 3.5–5.5)
BUN/Creatinine Ratio: 27 — ABNORMAL HIGH (ref 9–23)
BUN: 17 mg/dL (ref 6–24)
CALCIUM: 9.3 mg/dL (ref 8.7–10.2)
CHLORIDE: 102 mmol/L (ref 97–108)
CO2: 24 mmol/L (ref 18–29)
Creatinine, Ser: 0.64 mg/dL (ref 0.57–1.00)
GFR calc Af Amer: 129 mL/min/{1.73_m2} (ref >59)
GFR calc non Af Amer: 112 mL/min/{1.73_m2} (ref >59)
GLOBULIN, TOTAL: 2.9 g/dL (ref 1.5–4.5)
Glucose: 98 mg/dL (ref 65–99)
Potassium: 4.7 mmol/L (ref 3.5–5.2)
Sodium: 138 mmol/L (ref 134–144)
TOTAL PROTEIN: 7.2 g/dL (ref 6.0–8.5)

## 2014-07-30 LAB — CBC
HEMATOCRIT: 39.6 % (ref 34.0–46.6)
Hemoglobin: 12.9 g/dL (ref 11.1–15.9)
MCH: 30.2 pg (ref 26.6–33.0)
MCHC: 32.6 g/dL (ref 31.5–35.7)
MCV: 93 fL (ref 79–97)
Platelets: 212 10*3/uL (ref 150–379)
RBC: 4.27 x10E6/uL (ref 3.77–5.28)
RDW: 13.2 % (ref 12.3–15.4)
WBC: 10 10*3/uL (ref 3.4–10.8)

## 2014-07-30 LAB — TSH: TSH: 0.346 u[IU]/mL — ABNORMAL LOW (ref 0.450–4.500)

## 2014-07-30 LAB — CA 125: CA 125: 25.6 U/mL (ref 0.0–38.1)

## 2014-07-31 ENCOUNTER — Telehealth: Payer: Self-pay | Admitting: Adult Health

## 2014-07-31 NOTE — Telephone Encounter (Signed)
Pt aware of labs  

## 2014-08-12 ENCOUNTER — Other Ambulatory Visit (HOSPITAL_COMMUNITY): Payer: Self-pay | Admitting: Internal Medicine

## 2014-08-12 DIAGNOSIS — Z1231 Encounter for screening mammogram for malignant neoplasm of breast: Secondary | ICD-10-CM

## 2014-08-27 ENCOUNTER — Ambulatory Visit (INDEPENDENT_AMBULATORY_CARE_PROVIDER_SITE_OTHER): Payer: 59

## 2014-08-27 DIAGNOSIS — N8320 Unspecified ovarian cysts: Secondary | ICD-10-CM

## 2014-08-27 DIAGNOSIS — N83201 Unspecified ovarian cyst, right side: Secondary | ICD-10-CM

## 2014-08-27 DIAGNOSIS — R102 Pelvic and perineal pain: Secondary | ICD-10-CM | POA: Diagnosis not present

## 2014-08-27 NOTE — Progress Notes (Signed)
US PELVIC TA/TV, anteverted uterus w/ a post lt fibroid 1.1 x .9 x 1.2cm,EEC 69mm,IUD w/in endometrial canal,normal lt ov,rt ov complex cyst 2.2 x 1.5 x 1.6cm,bilat adnexal pain during ultrasound,ov's appear to be mobile.

## 2014-09-01 ENCOUNTER — Telehealth: Payer: Self-pay | Admitting: Adult Health

## 2014-09-01 NOTE — Telephone Encounter (Signed)
Pt aware of [place] results, will put in recall for 6 months to re [place] for stability of rt ovarian cyst

## 2014-09-07 ENCOUNTER — Ambulatory Visit (HOSPITAL_COMMUNITY): Payer: 59

## 2014-09-28 ENCOUNTER — Ambulatory Visit (HOSPITAL_COMMUNITY)
Admission: RE | Admit: 2014-09-28 | Discharge: 2014-09-28 | Disposition: A | Discharge status: Home / Self Care | Payer: 59 | Source: Ambulatory Visit | Attending: Internal Medicine | Admitting: Internal Medicine

## 2014-09-28 DIAGNOSIS — Z1231 Encounter for screening mammogram for malignant neoplasm of breast: Secondary | ICD-10-CM

## 2014-10-01 ENCOUNTER — Other Ambulatory Visit: Payer: Self-pay | Admitting: Internal Medicine

## 2014-10-01 DIAGNOSIS — R928 Other abnormal and inconclusive findings on diagnostic imaging of breast: Secondary | ICD-10-CM

## 2014-10-08 ENCOUNTER — Telehealth: Payer: Self-pay | Admitting: Adult Health

## 2014-10-08 NOTE — Telephone Encounter (Signed)
Had abnormal mammogram and yes she needs to go get further imaging, i read report to her and explained her breasts are dense so they can't see as well and she needs to go and she said she will, need to check asymmetry and in left and calcifications in right

## 2014-10-13 ENCOUNTER — Other Ambulatory Visit: Payer: Self-pay | Admitting: Internal Medicine

## 2014-10-13 ENCOUNTER — Ambulatory Visit (HOSPITAL_COMMUNITY)
Admission: RE | Admit: 2014-10-13 | Discharge: 2014-10-13 | Disposition: A | Discharge status: Home / Self Care | Payer: 59 | Source: Ambulatory Visit | Attending: Internal Medicine | Admitting: Internal Medicine

## 2014-10-13 DIAGNOSIS — R921 Mammographic calcification found on diagnostic imaging of breast: Secondary | ICD-10-CM

## 2014-10-13 DIAGNOSIS — R928 Other abnormal and inconclusive findings on diagnostic imaging of breast: Secondary | ICD-10-CM

## 2014-10-15 ENCOUNTER — Other Ambulatory Visit: Payer: Self-pay | Admitting: Internal Medicine

## 2014-10-15 DIAGNOSIS — R921 Mammographic calcification found on diagnostic imaging of breast: Secondary | ICD-10-CM

## 2014-10-16 ENCOUNTER — Ambulatory Visit
Admission: RE | Admit: 2014-10-16 | Discharge: 2014-10-16 | Disposition: A | Discharge status: Home / Self Care | Payer: 59 | Source: Ambulatory Visit | Attending: Internal Medicine | Admitting: Internal Medicine

## 2014-10-16 DIAGNOSIS — R921 Mammographic calcification found on diagnostic imaging of breast: Secondary | ICD-10-CM

## 2015-12-02 ENCOUNTER — Other Ambulatory Visit: Payer: 59 | Admitting: Adult Health

## 2015-12-09 ENCOUNTER — Other Ambulatory Visit: Payer: 59 | Admitting: Adult Health

## 2015-12-29 ENCOUNTER — Other Ambulatory Visit: Payer: Self-pay | Admitting: Obstetrics and Gynecology

## 2015-12-29 DIAGNOSIS — Z1231 Encounter for screening mammogram for malignant neoplasm of breast: Secondary | ICD-10-CM

## 2016-01-04 ENCOUNTER — Ambulatory Visit (INDEPENDENT_AMBULATORY_CARE_PROVIDER_SITE_OTHER): Payer: 59 | Admitting: Adult Health

## 2016-01-04 ENCOUNTER — Encounter: Payer: Self-pay | Admitting: Adult Health

## 2016-01-04 ENCOUNTER — Other Ambulatory Visit (HOSPITAL_COMMUNITY)
Admission: RE | Admit: 2016-01-04 | Discharge: 2016-01-04 | Disposition: A | Discharge status: Home / Self Care | Payer: 59 | Source: Ambulatory Visit | Attending: Adult Health | Admitting: Adult Health

## 2016-01-04 VITALS — BP 128/82 | HR 82 | Ht 64.25 in | Wt 167.0 lb

## 2016-01-04 DIAGNOSIS — Z975 Presence of (intrauterine) contraceptive device: Secondary | ICD-10-CM

## 2016-01-04 DIAGNOSIS — Z01419 Encounter for gynecological examination (general) (routine) without abnormal findings: Secondary | ICD-10-CM | POA: Diagnosis present

## 2016-01-04 DIAGNOSIS — Z1212 Encounter for screening for malignant neoplasm of rectum: Secondary | ICD-10-CM

## 2016-01-04 DIAGNOSIS — Z1151 Encounter for screening for human papillomavirus (HPV): Secondary | ICD-10-CM | POA: Diagnosis not present

## 2016-01-04 LAB — HEMOCCULT GUIAC POC 1CARD (OFFICE): Fecal Occult Blood, POC: NEGATIVE

## 2016-01-04 NOTE — Progress Notes (Signed)
Patient ID: Gloria Acosta, female   DOB: 1975/05/13, 40 y.o.   MRN: [id number] History of Present Illness: Gloria Acosta is a 40 year old white female,married in for a well woman gyn exam and pap.Has no periods with IUD. PCP is Dr Gerarda Fraction.   Current Medications, Allergies, Past Medical History, Past Surgical History, Family History and Social History were reviewed in [facility] record.     Review of Systems: Patient denies any headaches, hearing loss, fatigue, blurred vision, shortness of breath, chest pain, abdominal pain, problems with bowel movements, urination, or intercourse. No joint pain or mood swings.    Physical Exam:BP 128/82 (BP Location: Left Arm, Patient Position: Sitting, Cuff Size: Normal)   Pulse 82   Ht 5' 4.25" (1.632 m)   Wt 167 lb (75.8 kg)   BMI 28.44 kg/m  General:  Well developed, well nourished, no acute distress Skin:  Warm and dry Neck:  Midline trachea, normal thyroid, good ROM, no lymphadenopathy Lungs; Clear to auscultation bilaterally Breast:  No dominant palpable mass, retraction, or nipple discharge Cardiovascular: Regular rate and rhythm Abdomen:  Soft, non tender, no hepatosplenomegaly Pelvic:  External genitalia is normal in appearance, no lesions.  The vagina is normal in appearance. Urethra has no lesions or masses. The cervix is smooth, pap with HPV performed, no IUD strings seen, US shows IUD in place.  Uterus is felt to be normal size, shape, and contour.  No adnexal masses or tenderness noted.Bladder is non tender, no masses felt. Rectal: Good sphincter tone, no polyps, or hemorrhoids felt.  Hemoccult negative. Extremities/musculoskeletal:  No swelling or varicosities noted, no clubbing or cyanosis Psych:  No mood changes, alert and cooperative,seems happy PHQ 2 score 1 Her IUD was placed 04/29/08 and is past due for removal, she is not sure if she wants another or not, it was very painful when inserted in 2010, she does not want  any more children has 71 year old daughter.Will give handouts on tubal and ablation.  Impression: 1. Encounter for gynecological examination with Papanicolaou smear of cervix   2. IUD (intrauterine device) in place       Plan: Review handout on tubal and ablation  Physical in 1 year Pap in 3 if normal Get mammogram now and yearly  Call with decision on IUD or tubal

## 2016-01-04 NOTE — Patient Instructions (Signed)
Physical in 1 year Pap in 3 if normal Get mammogram now and yearly  Call with decision on IUD or tubal

## 2016-01-07 LAB — CYTOLOGY - PAP
Diagnosis: NEGATIVE
HPV (WINDOPATH): NOT DETECTED

## 2016-01-31 ENCOUNTER — Ambulatory Visit
Admission: RE | Admit: 2016-01-31 | Discharge: 2016-01-31 | Disposition: A | Discharge status: Home / Self Care | Payer: 59 | Source: Ambulatory Visit | Attending: Obstetrics and Gynecology | Admitting: Obstetrics and Gynecology

## 2016-01-31 DIAGNOSIS — Z1231 Encounter for screening mammogram for malignant neoplasm of breast: Secondary | ICD-10-CM

## 2016-03-22 DIAGNOSIS — G894 Chronic pain syndrome: Secondary | ICD-10-CM | POA: Diagnosis not present

## 2016-03-22 DIAGNOSIS — E271 Primary adrenocortical insufficiency: Secondary | ICD-10-CM | POA: Diagnosis not present

## 2016-03-22 DIAGNOSIS — Z1389 Encounter for screening for other disorder: Secondary | ICD-10-CM | POA: Diagnosis not present

## 2016-06-30 DIAGNOSIS — M7732 Calcaneal spur, left foot: Secondary | ICD-10-CM | POA: Diagnosis not present

## 2016-06-30 DIAGNOSIS — Z1389 Encounter for screening for other disorder: Secondary | ICD-10-CM | POA: Diagnosis not present

## 2016-06-30 DIAGNOSIS — G629 Polyneuropathy, unspecified: Secondary | ICD-10-CM | POA: Diagnosis not present

## 2016-06-30 DIAGNOSIS — E271 Primary adrenocortical insufficiency: Secondary | ICD-10-CM | POA: Diagnosis not present

## 2016-06-30 DIAGNOSIS — M722 Plantar fascial fibromatosis: Secondary | ICD-10-CM | POA: Diagnosis not present

## 2016-06-30 DIAGNOSIS — R5383 Other fatigue: Secondary | ICD-10-CM | POA: Diagnosis not present

## 2016-08-14 ENCOUNTER — Ambulatory Visit (INDEPENDENT_AMBULATORY_CARE_PROVIDER_SITE_OTHER): Payer: 59 | Admitting: Obstetrics and Gynecology

## 2016-08-14 ENCOUNTER — Encounter: Payer: Self-pay | Admitting: Obstetrics and Gynecology

## 2016-08-14 VITALS — BP 118/78 | HR 60 | Wt 176.4 lb

## 2016-08-14 DIAGNOSIS — Z3202 Encounter for pregnancy test, result negative: Secondary | ICD-10-CM

## 2016-08-14 DIAGNOSIS — Z30433 Encounter for removal and reinsertion of intrauterine contraceptive device: Secondary | ICD-10-CM | POA: Diagnosis not present

## 2016-08-14 LAB — POCT URINE PREGNANCY: Preg Test, Ur: NEGATIVE

## 2016-08-14 MED ORDER — DOXYCYCLINE HYCLATE 100 MG PO CAPS: 100.0000 mg | ORAL_CAPSULE | Freq: Two times a day (BID) | ORAL | 0 refills | Status: DC

## 2016-08-14 NOTE — Progress Notes (Signed)
  Gloria Acosta is a 41 y.o. G2P0011 here for [place] IUD removal and Kylenna IUD insertion. Pt was seen earlier today for same but removal was unsuccessful. 2 mm laminaria was inserted during first visit with the hopes that it will provide more room for removal.  The afternoon exam showed the cervix to be dilated to 4-6 mm by the laminaria and we were able to probe and inside the uterine cavity and after several minutes of efforts with the IUD hook and then a uterine packing forceps we were able to identify the string and extract field IUD. IUD Removal and Reinsertion  Patient identified, informed consent performed. Discussed risks of irregular bleeding, cramping, infection, malpositioning or misplacement of the IUD outside the uterus which may require further procedures. Time out was performed. Speculum placed in the vagina. Laminaria removed. Cervix visualized. Cleaned with Betadine x 2. Grasped anteriorly with a single tooth tenaculum. The strings of the Lequita Asal IUD were grasped and pulled using uterine packing forceps. The IUD was successfully removed in its entirety.   Uterus sounded to 9 cm. Kylenna IUD placed per manufacturer's recommendations. Strings trimmed to -3 cm. Tenaculum was removed, good hemostasis noted. Patient tolerated procedure well. Patient was given post-procedure instructions.  Patient was also asked to check IUD strings periodically and offered follow up in 4 weeks for IUD check.  She will be given doxycycline 5 days due to the multiple efforts involved in this IUD removal and reinsertion   By signing my name below, I, Evelene Croon, attest that this documentation has been prepared under the direction and in the presence of Jonnie Kind, MD . Electronically Signed: Evelene Croon, Scribe. 08/14/2016. 4:59 PM. I personally performed the services described in this documentation, which was SCRIBED in my presence. The recorded information has been reviewed and considered  accurate. It has been edited as necessary during review. Jonnie Kind, MD

## 2016-08-14 NOTE — Progress Notes (Signed)
Patient seen approximately 11 AM initially Gloria Acosta is a 41 y.o. G2P0011 here for Mirena IUD removal and reinsertion. She has had the current IUD for ~7-8 years. No new GYN concerns.  Last pap smear  was normal  she had had a very difficult time getting the last IUD out and replaced. She describes that isn't "nearly killing her and this part of the reason she is weighted 7 years  Bedside US:  Anteflexed uterus, above the old C-section scar IUD seen and appears normal in position with wings deployed appropriately The patient is unable to have a uterus probed. We can even insert a 3 mm hook to try to identify the IUD string. Counseled patient on different options and the patient is willing to try placement of the laminaria and have her come back in a few hours and try to remove laminaria and see if that will give [place] additional room. 2 mm laminaria is placed with saline and Betadine soaked gauze beneath it. The patient will return in 5 hours, at 4 PM IUD Removal and Reinsertion  Patient identified, informed consent performed. Discussed risks of irregular bleeding, cramping, infection, malpositioning or misplacement of the IUD outside the uterus which may require further procedures. Time out was performed. Speculum placed in the vagina. Cervix visualized. Cleaned with Betadine x 2. Unable to successfully remove IUD at this time.   Assessment:  1. Prolonged IUD use 2. Cervical stenosis s/p c-section 3. Severely antiflexed uterus   Plan:  1. Inserted laminaria in office today 2. Pt to return to at 4p today for attempt at IUD removal     By signing my name below, I, Evelene Croon, attest that this documentation has been prepared under the direction and in the presence of Jonnie Kind, MD . Electronically Signed: Evelene Croon, Scribe. 08/14/2016. 11:28 AM. I personally performed the services described in this documentation, which was SCRIBED in my presence. The recorded information has been  reviewed and considered accurate. It has been edited as necessary during review. Jonnie Kind, MD

## 2016-09-11 ENCOUNTER — Ambulatory Visit: Payer: 59 | Admitting: Obstetrics and Gynecology

## 2016-09-14 DIAGNOSIS — I872 Venous insufficiency (chronic) (peripheral): Secondary | ICD-10-CM | POA: Diagnosis not present

## 2016-09-14 DIAGNOSIS — G894 Chronic pain syndrome: Secondary | ICD-10-CM | POA: Diagnosis not present

## 2016-10-20 DIAGNOSIS — B354 Tinea corporis: Secondary | ICD-10-CM | POA: Diagnosis not present

## 2016-10-20 DIAGNOSIS — R300 Dysuria: Secondary | ICD-10-CM | POA: Diagnosis not present

## 2016-10-20 DIAGNOSIS — N029 Recurrent and persistent hematuria with unspecified morphologic changes: Secondary | ICD-10-CM | POA: Diagnosis not present

## 2016-10-25 ENCOUNTER — Other Ambulatory Visit: Payer: Self-pay | Admitting: Internal Medicine

## 2016-10-25 DIAGNOSIS — Z1231 Encounter for screening mammogram for malignant neoplasm of breast: Secondary | ICD-10-CM

## 2016-10-31 ENCOUNTER — Other Ambulatory Visit: Payer: Self-pay | Admitting: Internal Medicine

## 2016-10-31 DIAGNOSIS — Z1231 Encounter for screening mammogram for malignant neoplasm of breast: Secondary | ICD-10-CM

## 2016-12-07 DIAGNOSIS — G64 Other disorders of peripheral nervous system: Secondary | ICD-10-CM | POA: Diagnosis not present

## 2016-12-07 DIAGNOSIS — S86899A Other injury of other muscle(s) and tendon(s) at lower leg level, unspecified leg, initial encounter: Secondary | ICD-10-CM | POA: Diagnosis not present

## 2017-01-06 DIAGNOSIS — L600 Ingrowing nail: Secondary | ICD-10-CM | POA: Diagnosis not present

## 2017-01-08 DIAGNOSIS — L03011 Cellulitis of right finger: Secondary | ICD-10-CM | POA: Diagnosis not present

## 2017-01-08 DIAGNOSIS — E271 Primary adrenocortical insufficiency: Secondary | ICD-10-CM | POA: Diagnosis not present

## 2017-01-09 ENCOUNTER — Encounter: Payer: Self-pay | Admitting: Orthopaedic Surgery

## 2017-01-09 ENCOUNTER — Encounter: Payer: Self-pay | Admitting: Orthopedic Surgery

## 2017-01-09 ENCOUNTER — Ambulatory Visit (HOSPITAL_COMMUNITY)
Admission: RE | Admit: 2017-01-09 | Discharge: 2017-01-09 | Disposition: A | Discharge status: Home / Self Care | Payer: 59 | Source: Ambulatory Visit | Attending: Orthopedic Surgery | Admitting: Orthopedic Surgery

## 2017-01-09 ENCOUNTER — Encounter (HOSPITAL_COMMUNITY)
Admission: RE | Disposition: A | Discharge status: Home / Self Care | Payer: Self-pay | Source: Ambulatory Visit | Attending: Orthopedic Surgery

## 2017-01-09 ENCOUNTER — Ambulatory Visit (INDEPENDENT_AMBULATORY_CARE_PROVIDER_SITE_OTHER): Payer: 59

## 2017-01-09 ENCOUNTER — Ambulatory Visit (INDEPENDENT_AMBULATORY_CARE_PROVIDER_SITE_OTHER): Payer: 59 | Admitting: Orthopedic Surgery

## 2017-01-09 VITALS — BP 137/78 | HR 95 | Temp 99.1°F | Ht 66.0 in | Wt 176.0 lb

## 2017-01-09 DIAGNOSIS — L03011 Cellulitis of right finger: Secondary | ICD-10-CM | POA: Diagnosis not present

## 2017-01-09 DIAGNOSIS — M79644 Pain in right finger(s): Secondary | ICD-10-CM

## 2017-01-09 DIAGNOSIS — Z87891 Personal history of nicotine dependence: Secondary | ICD-10-CM | POA: Diagnosis not present

## 2017-01-09 HISTORY — PX: IRRIGATION AND DEBRIDEMENT ABSCESS: SHX5252

## 2017-01-09 SURGERY — MINOR INCISION AND DRAINAGE OF ABSCESS
Anesthesia: LOCAL | Laterality: Right

## 2017-01-09 MED ORDER — CHLORHEXIDINE GLUCONATE 4 % EX LIQD: 60.0000 mL | Freq: Once | CUTANEOUS | Status: DC

## 2017-01-09 MED ORDER — LIDOCAINE HCL (PF) 1 % IJ SOLN
INTRAMUSCULAR | Status: AC
  Filled 2017-01-09: qty 30

## 2017-01-09 MED ORDER — DOXYCYCLINE HYCLATE 100 MG PO CAPS: 100.0000 mg | ORAL_CAPSULE | Freq: Two times a day (BID) | ORAL | 0 refills | Status: DC

## 2017-01-09 MED ORDER — LIDOCAINE HCL (PF) 1 % IJ SOLN
INTRAMUSCULAR | Status: DC | PRN
  Administered 2017-01-09: 8 mL

## 2017-01-09 MED ORDER — POVIDONE-IODINE 10 % EX SWAB: 2.0000 "application " | Freq: Once | CUTANEOUS | Status: DC

## 2017-01-09 NOTE — Progress Notes (Signed)
  NEW PATIENT OFFICE VISIT    Chief Complaint  Patient presents with  . New Patient (Initial Visit)    Right Ring Finger    HPI   41 year old female presents with pain and swelling erythema right ring finger 5 days  No history of trauma  She does complain of severe pain dull constant located over the right ring finger with erythema and purulent drainage  Review of Systems  Constitutional: Negative for chills, fever and weight loss.  Respiratory: Negative for shortness of breath.   Cardiovascular: Negative for chest pain.  Neurological: Negative for tingling.     Past Medical History:  Diagnosis Date  . Addison's disease ([place])   . Back pain    2 bulging disc,DJD  . Constipation 07/29/2014  . IUD (intrauterine device) in place 03/18/2013   Seen on [place]  . Pelvic pain in female 07/29/2014  . Right ovarian cyst 07/29/2014    Past Surgical History:  Procedure Laterality Date  . CESAREAN SECTION    . CHOLECYSTECTOMY    . ECTOPIC PREGNANCY SURGERY    . OVARIAN CYST REMOVAL    . TUBAL LIGATION      Family History  Problem Relation Age of Onset  . Diabetes Mother   . Hypertension Mother   . Breast cancer Mother   . Cancer Father        brain and liver pancreas  . Hypertension Father   . Cancer Maternal Aunt   . Cancer Paternal Aunt        breast  . Cancer Maternal Grandmother        ovarian   . Stroke Maternal Grandfather   . Diabetes Paternal Grandfather   . Heart attack Paternal Grandfather    Social History   Tobacco Use  . Smoking status: Former Smoker    Packs/day: 0.00    Years: 21.00    Pack years: 0.00    Types: Cigarettes  . Smokeless tobacco: Never Used  Substance Use Topics  . Alcohol use: Yes    Comment: occ. glass of wine  . Drug use: No      No outpatient medications have been marked as taking for the 01/09/17 encounter (Office Visit) with Carole Civil, MD.    BP 137/78   Pulse 95   Temp 99.1 F (37.3 C)   Ht 5\' 6"  (1.676 m)    Wt 176 lb (79.8 kg)   BMI 28.41 kg/m   Physical Exam She is awake alert oriented x3 mood and affect normal well-developed well-nourished gait normal  Ortho Exam Right ring finger is swollen and tender over the dorsum and radial side at the eponychial fold is decreased range of motion of the joint but no instability flexor tendon and extensor tendon strength is normal skin is red and erythematous neurovascular exam is intact    Encounter Diagnoses  Name Primary?  . Pain of finger of right hand Yes  . Paronychia of right ring finger      PLAN:   Plan incision and drainage right ring finger at minor procedure room at the hospital

## 2017-01-09 NOTE — Discharge Instructions (Signed)

## 2017-01-09 NOTE — Brief Op Note (Signed)
01/09/2017  1:44 PM  PATIENT:  Gloria Acosta  41 y.o. female  PRE-OPERATIVE DIAGNOSIS:  PARONYCHIA RIGHT RING FINGER  POST-OPERATIVE DIAGNOSIS:  same PROCEDURE:  Procedure(s): MINOR INCISION AND DRAINAGE OF ABSCESS RIGHT RING FINGER (Right) paronychia  SURGEON:  Surgeon(s) and Role:    * Carole Civil, MD - Primary  PHYSICIAN ASSISTANT:   ASSISTANTS: none   ANESTHESIA:   local  EBL:  none   BLOOD ADMINISTERED:none  DRAINS: none   LOCAL MEDICATIONS USED:  LIDOCAINE   SPECIMEN:  Source of Specimen:  culture of wound   DISPOSITION OF SPECIMEN:  micro  COUNTS:  YES  TOURNIQUET:  * No tourniquets in log *  DICTATION: .Dragon Dictation  PLAN OF CARE: Discharge to home after PACU  PATIENT DISPOSITION:  PACU - hemodynamically stable.   Delay start of Pharmacological VTE agent (>24hrs) due to surgical blood loss or risk of bleeding: not applicable

## 2017-01-09 NOTE — H&P (Signed)
Chief Complaint  Patient presents with  . New Patient (Initial Visit)      Right Ring Finger      HPI    41 year old female presents with pain and swelling erythema right ring finger 5 days   No history of trauma   She does complain of severe pain dull constant located over the right ring finger with erythema and purulent drainage   Review of Systems  Constitutional: Negative for chills, fever and weight loss.  Respiratory: Negative for shortness of breath.   Cardiovascular: Negative for chest pain.  Neurological: Negative for tingling.            Past Medical History:  Diagnosis Date  . Addison's disease ([place])    . Back pain      2 bulging disc,DJD  . Constipation 07/29/2014  . IUD (intrauterine device) in place 03/18/2013    Seen on [place]  . Pelvic pain in female 07/29/2014  . Right ovarian cyst 07/29/2014           Past Surgical History:  Procedure Laterality Date  . CESAREAN SECTION      . CHOLECYSTECTOMY      . ECTOPIC PREGNANCY SURGERY      . OVARIAN CYST REMOVAL      . TUBAL LIGATION               Family History  Problem Relation Age of Onset  . Diabetes Mother    . Hypertension Mother    . Breast cancer Mother    . Cancer Father          brain and liver pancreas  . Hypertension Father    . Cancer Maternal Aunt    . Cancer Paternal Aunt          breast  . Cancer Maternal Grandmother          ovarian   . Stroke Maternal Grandfather    . Diabetes Paternal Grandfather    . Heart attack Paternal Grandfather      Social History         Tobacco Use  . Smoking status: Former Smoker      Packs/day: 0.00      Years: 21.00      Pack years: 0.00      Types: Cigarettes  . Smokeless tobacco: Never Used  Substance Use Topics  . Alcohol use: Yes      Comment: occ. glass of wine  . Drug use: No          Active Medications  No outpatient medications have been marked as taking for the 01/09/17 encounter (Office Visit) with Carole Civil, MD.         BP 137/78   Pulse 95   Temp 99.1 F (37.3 C)   Ht 5\' 6"  (1.676 m)   Wt 176 lb (79.8 kg)   BMI 28.41 kg/m    Physical Exam She is awake alert oriented x3 mood and affect normal well-developed well-nourished gait normal   Ortho Exam Right ring finger is swollen and tender over the dorsum and radial side at the eponychial fold is decreased range of motion of the joint but no instability flexor tendon and extensor tendon strength is normal skin is red and erythematous neurovascular exam is intact       Encounter Diagnoses  Name Primary?  . Pain of finger of right hand Yes  . Paronychia of right ring finger  PLAN:    Plan incision and drainage right ring finger at minor procedure room at the hospital

## 2017-01-09 NOTE — Op Note (Addendum)
01/09/2017  1:44 PM  PATIENT:  Gloria Acosta  41 y.o. female  PRE-OPERATIVE DIAGNOSIS:  PARONYCHIA RIGHT RING FINGER  POST-OPERATIVE DIAGNOSIS:  same PROCEDURE:  Procedure(s): MINOR INCISION AND DRAINAGE OF ABSCESS RIGHT RING FINGER (Right) paronychia PARONYCHIA INCISION AND DRAINAGE    SURGEON:  Surgeon(s) and Role:    * Carole Civil, MD - Primary  PHYSICIAN ASSISTANT:   ASSISTANTS: none   ANESTHESIA:   local  EBL:  none   BLOOD ADMINISTERED:none  DRAINS: none   LOCAL MEDICATIONS USED:  LIDOCAINE   SPECIMEN:  Source of Specimen:  culture of wound   DISPOSITION OF SPECIMEN:  micro  COUNTS:  YES  TOURNIQUET:  * No tourniquets in log *  DICTATION: .Dragon Dictation  Site confirmed Consent done  Chart reviewed   1% lidocaine plain for digital block  Prep with betadine Time out   8 min wait for block   I unroofed the cuticle and incised the nail fold and drained pus 1cc. Culture .  Dressing   PLAN OF CARE: Discharge to home after PACU  PATIENT DISPOSITION:  PACU - hemodynamically stable.   Delay start of Pharmacological VTE agent (>24hrs) due to surgical blood loss or risk of bleeding: not applicable

## 2017-01-10 ENCOUNTER — Ambulatory Visit (INDEPENDENT_AMBULATORY_CARE_PROVIDER_SITE_OTHER): Payer: 59 | Admitting: Orthopedic Surgery

## 2017-01-10 ENCOUNTER — Encounter: Payer: Self-pay | Admitting: Orthopedic Surgery

## 2017-01-10 DIAGNOSIS — Z4889 Encounter for other specified surgical aftercare: Secondary | ICD-10-CM

## 2017-01-10 NOTE — Patient Instructions (Signed)
Soak the finger twice a day with absent salt or table salt warm water and a drop of antibacterial dish detergent.  Keep in the water for 10 minutes  Redress continue antibiotics follow-up in a week  Out of work through Wednesday, 28 November

## 2017-01-10 NOTE — Progress Notes (Signed)
Encounter Diagnosis  Name Primary?  Marland Kitchen Aftercare following surgery Yes   Chief Complaint  Patient presents with  . Routine Post Op    yesterday     Postop day 1 irrigation debridement incision and drainage paronychia right ring finger  Dressing changed  Wound looks clean dry and intact sanguinous drainage  Follow-up in 1 week  Soaking instructions given continue doxycycline patient allergic to sulfa and penicillin  Meds ordered this encounter  Medications  . amphetamine-dextroamphetamine (ADDERALL) 10 MG tablet  . ALPRAZolam (XANAX) 0.25 MG tablet   Current Meds  Medication Sig  . ALPRAZolam (XANAX) 0.25 MG tablet   . amphetamine-dextroamphetamine (ADDERALL) 10 MG tablet   . aspirin-acetaminophen-caffeine (EXCEDRIN MIGRAINE) 250-250-65 MG tablet Take 2 tablets by mouth as needed for headache.  . doxycycline (VIBRAMYCIN) 100 MG capsule Take 1 capsule (100 mg total) 2 (two) times daily by mouth.  . gabapentin (NEURONTIN) 100 MG capsule Take 100 mg by mouth 2 (two) times daily as needed.   Marland Kitchen HYDROcodone-acetaminophen (NORCO) 10-325 MG per tablet Take 1 tablet by mouth as needed.   . Multiple Vitamin (MULTIVITAMIN) tablet Take 1 tablet by mouth daily.  . predniSONE (DELTASONE) 5 MG tablet Take 5 mg by mouth daily with breakfast.

## 2017-01-10 NOTE — Op Note (Signed)
01/09/2017  1:44 PM  PATIENT:  Gloria Acosta  41 y.o. female  PRE-OPERATIVE DIAGNOSIS:  PARONYCHIA RIGHT RING FINGER  POST-OPERATIVE DIAGNOSIS:  same PROCEDURE:  Procedure(s): MINOR INCISION AND DRAINAGE OF ABSCESS RIGHT RING FINGER (Right) paronychia  SURGEON:  Surgeon(s) and Role:    * Carole Civil, MD - Primary  PHYSICIAN ASSISTANT:   ASSISTANTS: none   ANESTHESIA:   local  EBL:  none   BLOOD ADMINISTERED:none  DRAINS: none   LOCAL MEDICATIONS USED:  LIDOCAINE   SPECIMEN:  Source of Specimen:  culture of wound   DISPOSITION OF SPECIMEN:  micro  COUNTS:  YES  TOURNIQUET:  * No tourniquets in log *  DICTATION: .Dragon Dictation  PLAN OF CARE: Discharge to home after PACU  PATIENT DISPOSITION:  PACU - hemodynamically stable.   Delay start of Pharmacological VTE agent (>24hrs) due to surgical blood loss or risk of bleeding: not applicable

## 2017-01-11 ENCOUNTER — Encounter (HOSPITAL_COMMUNITY): Payer: Self-pay | Admitting: Orthopedic Surgery

## 2017-01-13 LAB — AEROBIC CULTURE  (SUPERFICIAL SPECIMEN): GRAM STAIN: NONE SEEN

## 2017-01-13 LAB — AEROBIC CULTURE W GRAM STAIN (SUPERFICIAL SPECIMEN)

## 2017-01-17 ENCOUNTER — Encounter: Payer: Self-pay | Admitting: Orthopedic Surgery

## 2017-01-17 ENCOUNTER — Ambulatory Visit (INDEPENDENT_AMBULATORY_CARE_PROVIDER_SITE_OTHER): Payer: 59 | Admitting: Orthopedic Surgery

## 2017-01-17 VITALS — BP 148/98 | HR 85 | Ht 66.0 in | Wt 176.0 lb

## 2017-01-17 DIAGNOSIS — Z4889 Encounter for other specified surgical aftercare: Secondary | ICD-10-CM

## 2017-01-17 MED ORDER — DOXYCYCLINE HYCLATE 100 MG PO CAPS: 100.0000 mg | ORAL_CAPSULE | Freq: Two times a day (BID) | ORAL | 0 refills | Status: DC

## 2017-01-17 NOTE — Progress Notes (Signed)
Chief Complaint  Patient presents with  . Routine Post Op    I&D 01/09/17    Postop incision and drainage of the ring finger on the right hand  She says it feels better she did run out of doxycycline  She is having a little bit of tenderness  She regained full range of motion it looks really good I recommend continue doxycycline follow-up in 2 weeks she can go back to work on the 28th

## 2017-01-17 NOTE — Patient Instructions (Signed)
Soak  take doxycycline

## 2017-01-24 ENCOUNTER — Ambulatory Visit (INDEPENDENT_AMBULATORY_CARE_PROVIDER_SITE_OTHER): Payer: 59 | Admitting: Orthopedic Surgery

## 2017-01-24 ENCOUNTER — Encounter: Payer: Self-pay | Admitting: Orthopedic Surgery

## 2017-01-24 VITALS — BP 131/75 | HR 78 | Ht 66.0 in | Wt 174.0 lb

## 2017-01-24 DIAGNOSIS — L03011 Cellulitis of right finger: Secondary | ICD-10-CM

## 2017-01-24 NOTE — Progress Notes (Signed)
Chief Complaint  Patient presents with  . Routine Post Op    dressing change right ring finger    Encounter Diagnosis  Name Primary?  . Paronychia of right ring finger I&D 01/09/17 Yes    Finger looks good today and there is no pain there is some hypersensitivity full range of motion is been restored  She will follow-up as needed return to work tomorrow

## 2017-01-31 ENCOUNTER — Telehealth: Payer: Self-pay | Admitting: Orthopedic Surgery

## 2017-01-31 ENCOUNTER — Ambulatory Visit: Payer: 59 | Admitting: Orthopedic Surgery

## 2017-01-31 NOTE — Telephone Encounter (Signed)
I called patient regarding appointment that had been scheduled for today, 01/31/17, for wound check. Upon reviewing after visit summary from last visit, which may not have been updated at time of check-out at last visit, 01/24/17, follow up as needed - please advise and contact patient.

## 2017-02-01 NOTE — Telephone Encounter (Signed)
I called her, she states finger is fine. As per last visit she should come in prn. The appt for 01/31/17 was previously to be cancelled, and was not.   She has asked about her bill, she has gotten a facility fee from [facility] for $2600, recovery room was more than $400, but she states she was never in an operating room or recovery room. What should I advise her regarding the billing, do you know who she should speak to at [facility]?

## 2017-02-01 NOTE — Telephone Encounter (Signed)
Call biiling

## 2017-02-05 ENCOUNTER — Ambulatory Visit: Payer: 59

## 2017-02-08 NOTE — Telephone Encounter (Signed)
I have called patient to advise.  

## 2017-03-01 DIAGNOSIS — L723 Sebaceous cyst: Secondary | ICD-10-CM | POA: Diagnosis not present

## 2017-03-01 DIAGNOSIS — L728 Other follicular cysts of the skin and subcutaneous tissue: Secondary | ICD-10-CM | POA: Diagnosis not present

## 2017-03-05 ENCOUNTER — Ambulatory Visit: Payer: 59

## 2017-03-05 ENCOUNTER — Ambulatory Visit
Admission: RE | Admit: 2017-03-05 | Discharge: 2017-03-05 | Disposition: A | Discharge status: Home / Self Care | Payer: 59 | Source: Ambulatory Visit | Attending: Internal Medicine | Admitting: Internal Medicine

## 2017-03-05 DIAGNOSIS — L723 Sebaceous cyst: Secondary | ICD-10-CM | POA: Diagnosis not present

## 2017-03-05 DIAGNOSIS — L728 Other follicular cysts of the skin and subcutaneous tissue: Secondary | ICD-10-CM | POA: Diagnosis not present

## 2017-03-05 DIAGNOSIS — Z1231 Encounter for screening mammogram for malignant neoplasm of breast: Secondary | ICD-10-CM

## 2017-03-20 DIAGNOSIS — R002 Palpitations: Secondary | ICD-10-CM | POA: Diagnosis not present

## 2017-03-20 DIAGNOSIS — E271 Primary adrenocortical insufficiency: Secondary | ICD-10-CM | POA: Diagnosis not present

## 2017-03-20 DIAGNOSIS — Z1389 Encounter for screening for other disorder: Secondary | ICD-10-CM | POA: Diagnosis not present

## 2017-03-20 DIAGNOSIS — G894 Chronic pain syndrome: Secondary | ICD-10-CM | POA: Diagnosis not present

## 2017-03-20 DIAGNOSIS — Z79891 Long term (current) use of opiate analgesic: Secondary | ICD-10-CM | POA: Diagnosis not present

## 2017-04-02 DIAGNOSIS — L709 Acne, unspecified: Secondary | ICD-10-CM | POA: Diagnosis not present

## 2017-04-02 DIAGNOSIS — L819 Disorder of pigmentation, unspecified: Secondary | ICD-10-CM | POA: Diagnosis not present

## 2017-04-02 DIAGNOSIS — L700 Acne vulgaris: Secondary | ICD-10-CM | POA: Diagnosis not present

## 2017-04-02 DIAGNOSIS — Z5181 Encounter for therapeutic drug level monitoring: Secondary | ICD-10-CM | POA: Diagnosis not present

## 2017-04-19 ENCOUNTER — Encounter: Payer: Self-pay | Admitting: Cardiovascular Disease

## 2017-05-03 DIAGNOSIS — L709 Acne, unspecified: Secondary | ICD-10-CM | POA: Diagnosis not present

## 2017-05-03 DIAGNOSIS — Z5181 Encounter for therapeutic drug level monitoring: Secondary | ICD-10-CM | POA: Diagnosis not present

## 2017-06-04 DIAGNOSIS — Z5181 Encounter for therapeutic drug level monitoring: Secondary | ICD-10-CM | POA: Diagnosis not present

## 2017-06-04 DIAGNOSIS — L709 Acne, unspecified: Secondary | ICD-10-CM | POA: Diagnosis not present

## 2017-06-27 DIAGNOSIS — G894 Chronic pain syndrome: Secondary | ICD-10-CM | POA: Diagnosis not present

## 2017-06-27 DIAGNOSIS — B354 Tinea corporis: Secondary | ICD-10-CM | POA: Diagnosis not present

## 2017-06-27 DIAGNOSIS — I10 Essential (primary) hypertension: Secondary | ICD-10-CM | POA: Diagnosis not present

## 2017-07-05 DIAGNOSIS — L709 Acne, unspecified: Secondary | ICD-10-CM | POA: Diagnosis not present

## 2017-07-05 DIAGNOSIS — Z5181 Encounter for therapeutic drug level monitoring: Secondary | ICD-10-CM | POA: Diagnosis not present

## 2017-07-17 DIAGNOSIS — J042 Acute laryngotracheitis: Secondary | ICD-10-CM | POA: Diagnosis not present

## 2017-07-17 DIAGNOSIS — G894 Chronic pain syndrome: Secondary | ICD-10-CM | POA: Diagnosis not present

## 2017-07-17 DIAGNOSIS — J329 Chronic sinusitis, unspecified: Secondary | ICD-10-CM | POA: Diagnosis not present

## 2017-08-09 DIAGNOSIS — L709 Acne, unspecified: Secondary | ICD-10-CM | POA: Diagnosis not present

## 2017-08-09 DIAGNOSIS — Z5181 Encounter for therapeutic drug level monitoring: Secondary | ICD-10-CM | POA: Diagnosis not present

## 2017-08-09 DIAGNOSIS — K130 Diseases of lips: Secondary | ICD-10-CM | POA: Diagnosis not present

## 2017-10-18 DIAGNOSIS — E782 Mixed hyperlipidemia: Secondary | ICD-10-CM | POA: Diagnosis not present

## 2017-10-18 DIAGNOSIS — Z0000 Encounter for general adult medical examination without abnormal findings: Secondary | ICD-10-CM | POA: Diagnosis not present

## 2017-10-18 DIAGNOSIS — E663 Overweight: Secondary | ICD-10-CM | POA: Diagnosis not present

## 2017-10-24 ENCOUNTER — Other Ambulatory Visit (HOSPITAL_COMMUNITY): Payer: Self-pay | Admitting: Internal Medicine

## 2017-10-24 DIAGNOSIS — M48062 Spinal stenosis, lumbar region with neurogenic claudication: Secondary | ICD-10-CM

## 2017-11-22 DIAGNOSIS — M47816 Spondylosis without myelopathy or radiculopathy, lumbar region: Secondary | ICD-10-CM | POA: Diagnosis not present

## 2017-11-22 DIAGNOSIS — M7989 Other specified soft tissue disorders: Secondary | ICD-10-CM | POA: Diagnosis not present

## 2017-11-22 DIAGNOSIS — M4807 Spinal stenosis, lumbosacral region: Secondary | ICD-10-CM | POA: Diagnosis not present

## 2018-01-23 DIAGNOSIS — Z1389 Encounter for screening for other disorder: Secondary | ICD-10-CM | POA: Diagnosis not present

## 2018-01-23 DIAGNOSIS — R002 Palpitations: Secondary | ICD-10-CM | POA: Diagnosis not present

## 2018-01-23 DIAGNOSIS — R0602 Shortness of breath: Secondary | ICD-10-CM | POA: Diagnosis not present

## 2018-01-23 DIAGNOSIS — R0789 Other chest pain: Secondary | ICD-10-CM | POA: Diagnosis not present

## 2018-01-31 ENCOUNTER — Ambulatory Visit (INDEPENDENT_AMBULATORY_CARE_PROVIDER_SITE_OTHER): Payer: 59 | Admitting: Cardiology

## 2018-01-31 VITALS — BP 132/86 | HR 104 | Ht 66.0 in | Wt 193.4 lb

## 2018-01-31 DIAGNOSIS — R0789 Other chest pain: Secondary | ICD-10-CM | POA: Diagnosis not present

## 2018-01-31 DIAGNOSIS — R0602 Shortness of breath: Secondary | ICD-10-CM | POA: Diagnosis not present

## 2018-01-31 DIAGNOSIS — R002 Palpitations: Secondary | ICD-10-CM | POA: Diagnosis not present

## 2018-01-31 DIAGNOSIS — R079 Chest pain, unspecified: Secondary | ICD-10-CM | POA: Insufficient documentation

## 2018-01-31 NOTE — Patient Instructions (Signed)
Medication Instructions:  Your physician recommends that you continue on your current medications as directed. Please refer to the Current Medication list given to you today.  If you need a refill on your cardiac medications before your next appointment, please call your pharmacy.   Lab work:  If you have labs (blood work) drawn today and your tests are completely normal, you will receive your results only by: Marland Kitchen MyChart Message (if you have MyChart) OR . A paper copy in the mail If you have any lab test that is abnormal or we need to change your treatment, we will call you to review the results.  Testing/Procedures: Your physician has requested that you have an echocardiogram. Echocardiography is a painless test that uses sound waves to create images of your heart. It provides your doctor with information about the size and shape of your heart and how well your heart's chambers and valves are working. This procedure takes approximately one hour. There are no restrictions for this procedure.  Your physician has requested that you have a lexiscan myoview. For further information please visit [email or web address]. Please follow instruction sheet, as given.  Your physician has recommended that you wear an event monitor. Event monitors are medical devices that record the heart's electrical activity. Doctors most often [place] these monitors to diagnose arrhythmias. Arrhythmias are problems with the speed or rhythm of the heartbeat. The monitor is a small, portable device. You can wear one while you do your normal daily activities. This is usually used to diagnose what is causing palpitations/syncope (passing out).  Follow-Up: As needed

## 2018-01-31 NOTE — Progress Notes (Signed)
Cardiology Office Note    Date:  02/01/2018   ID:  Gloria Acosta, DOB [birth year], MRN [id number]  PCP:  Redmond School, MD  Cardiologist:  Fransico Him, MD   Chief Complaint  Patient presents with  . Chest Pain  . Palpitations    History of Present Illness:  Gloria Acosta is a 42 y.o. female who is being seen today for the evaluation of chest pain and palpitations at the request of Redmond School, MD.  This is a 42yo female with a history of Addison's dz who had been having atypical CP and palpitations.  She has a history of Addison's disease and has significant problems with severe lethargy and fatigue and is not able to exercise.  In the past year she has gained 53 pounds.  She has noted recently that she is severely short of breath when walking to the mailbox.  She is also been having some tightness in her chest worse with deep breathing and bending over.  She says it is not exacerbated by exertion because she really does not exert herself.  There is no associated symptoms of nausea, vomiting or diaphoresis.  There is no radiation of the pain.  She is quite tearful on exam today because she feels terrible.  She says she hurts all over.  She is also been having problems with palpitations.  She says she notices that her heart is racing and will notice it beating up to 120 bpm.  She has remote history of tobacco use but has not smoked in years.  She is adopted but does know that her biological paternal father had an MI although he died of cancer in her paternal grandmother died of an MI.   Past Medical History:  Diagnosis Date  . Addison's disease ([place])   . Back pain    2 bulging disc,DJD  . Constipation 07/29/2014  . IUD (intrauterine device) in place 03/18/2013   Seen on [place]  . Pelvic pain in female 07/29/2014  . Right ovarian cyst 07/29/2014    Past Surgical History:  Procedure Laterality Date  . BREAST BIOPSY Right   . CESAREAN SECTION    . CHOLECYSTECTOMY    . ECTOPIC  PREGNANCY SURGERY    . IRRIGATION AND DEBRIDEMENT ABSCESS Right 01/09/2017   Procedure: MINOR INCISION AND DRAINAGE OF ABSCESS RIGHT RING FINGER;  Surgeon: Carole Civil, MD;  Location: [facility];  Service: Orthopedics;  Laterality: Right;  . OVARIAN CYST REMOVAL    . TUBAL LIGATION      Current Medications: Current Meds  Medication Sig  . ALPRAZolam (XANAX) 0.25 MG tablet Take 0.25 mg by mouth at bedtime.   Marland Kitchen amphetamine-dextroamphetamine (ADDERALL) 10 MG tablet   . aspirin-acetaminophen-caffeine (EXCEDRIN MIGRAINE) 250-250-65 MG tablet Take 2 tablets by mouth as needed for headache.  . doxycycline (VIBRAMYCIN) 100 MG capsule Take 1 capsule (100 mg total) by mouth 2 (two) times daily.  . furosemide (LASIX) 20 MG tablet Take 20 mg by mouth daily.  Marland Kitchen HYDROcodone-acetaminophen (NORCO) 10-325 MG per tablet Take 1 tablet by mouth as needed.   . Multiple Vitamin (MULTIVITAMIN) tablet Take 1 tablet by mouth daily.    Allergies:   Penicillins and Sulfa antibiotics   Social History   Socioeconomic History  . Marital status: Divorced    Spouse name: Not on file  . Number of children: Not on file  . Years of education: Not on file  . Highest education level: Not on file  Occupational History  . Not on file  Social Needs  . Financial resource strain: Not on file  . Food insecurity:    Worry: Not on file    Inability: Not on file  . Transportation needs:    Medical: Not on file    Non-medical: Not on file  Tobacco Use  . Smoking status: Former Smoker    Packs/day: 0.00    Years: 21.00    Pack years: 0.00    Types: Cigarettes  . Smokeless tobacco: Never Used  Substance and Sexual Activity  . Alcohol use: Yes    Comment: occ. glass of wine  . Drug use: No  . Sexual activity: Yes    Birth control/protection: IUD  Lifestyle  . Physical activity:    Days per week: Not on file    Minutes per session: Not on file  . Stress: Not on file  Relationships  . Social connections:      Talks on phone: Not on file    Gets together: Not on file    Attends religious service: Not on file    Active member of club or organization: Not on file    Attends meetings of clubs or organizations: Not on file    Relationship status: Not on file  Other Topics Concern  . Not on file  Social History Narrative  . Not on file     Family History:  The patient's family history includes Breast cancer in her mother; Cancer in her father, maternal aunt, maternal grandmother, and paternal aunt; Diabetes in her mother and paternal grandfather; Heart attack in her paternal grandfather; Hypertension in her father and mother; Stroke in her maternal grandfather.   ROS:   Please see the history of present illness.    ROS All other systems reviewed and are negative.  No flowsheet data found.     PHYSICAL EXAM:   VS:  BP 132/86   Pulse (!) 104   Ht 5\' 6"  (1.676 m)   Wt 193 lb 6.4 oz (87.7 kg)   SpO2 99%   BMI 31.22 kg/m    GEN: Well nourished, well developed, in no acute distress  HEENT: normal  Neck: no JVD, carotid bruits, or masses Cardiac: RRR; no murmurs, rubs, or gallops,no edema.  Intact distal pulses bilaterally.  Respiratory:  clear to auscultation bilaterally, normal work of breathing GI: soft, nontender, nondistended, + BS MS: no deformity or atrophy  Skin: warm and dry, no rash Neuro:  Alert and Oriented x 3, Strength and sensation are intact Psych: euthymic mood, full affect  Wt Readings from Last 3 Encounters:  01/31/18 193 lb 6.4 oz (87.7 kg)  01/24/17 174 lb (78.9 kg)  01/17/17 176 lb (79.8 kg)      Studies/Labs Reviewed:   EKG:  EKG is ordered today.  The ekg ordered today demonstrates sinus tachycardia at 102bpm with no ST changes  Recent Labs: No results found for requested labs within last 8760 hours.   Lipid Panel    Component Value Date/Time   CHOL 201 (H) 07/29/2014 1620   TRIG 57 07/29/2014 1620   HDL 61 07/29/2014 1620   CHOLHDL 3.3  07/29/2014 1620   LDLCALC 129 (H) 07/29/2014 1620    Additional studies/ records that were reviewed today include:  Office notes from PCP    ASSESSMENT:    1. SOB (shortness of breath)   2. Chest pain, unspecified type   3. Palpitations      PLAN:  In order of problems listed above:  1.  SOB - I suspect that her SOB is multifactorial from deconditioning and sedentary state.  I will get a 2D echo to assess LVF.  2.  Chest pain - her pain is somewhat atypical.  It is a tightness that is midsternal but no radiation and no associated sx of SOB, Nausea or diaphoresis.  EKG is nonischemia.  She has a remote hx of tobacco use. Her grandfather had an MI.  She has severe fatigue and body pain from her Addison's so I do not think that she will be able to walk on a treadmill.  I will get a Lexiscan myoview to rule out ischemia.   3.  Palpitations - She occasionally notices her heart racing especially if she gets upset.  Her HR is midly elevated but she is upset because her whole body aches and was crying.  I will get an event monitor to assess further.     Medication Adjustments/Labs and Tests Ordered: Current medicines are reviewed at length with the patient today.  Concerns regarding medicines are outlined above.  Medication changes, Labs and Tests ordered today are listed in the Patient Instructions below.  Patient Instructions  Medication Instructions:  Your physician recommends that you continue on your current medications as directed. Please refer to the Current Medication list given to you today.  If you need a refill on your cardiac medications before your next appointment, please call your pharmacy.   Lab work:  If you have labs (blood work) drawn today and your tests are completely normal, you will receive your results only by: Marland Kitchen MyChart Message (if you have MyChart) OR . A paper copy in the mail If you have any lab test that is abnormal or we need to change your treatment,  we will call you to review the results.  Testing/Procedures: Your physician has requested that you have an echocardiogram. Echocardiography is a painless test that uses sound waves to create images of your heart. It provides your doctor with information about the size and shape of your heart and how well your heart's chambers and valves are working. This procedure takes approximately one hour. There are no restrictions for this procedure.  Your physician has requested that you have a lexiscan myoview. For further information please visit [facility]. Please follow instruction sheet, as given.  Your physician has recommended that you wear an event monitor. Event monitors are medical devices that record the heart's electrical activity. Doctors most often [place] these monitors to diagnose arrhythmias. Arrhythmias are problems with the speed or rhythm of the heartbeat. The monitor is a small, portable device. You can wear one while you do your normal daily activities. This is usually used to diagnose what is causing palpitations/syncope (passing out).  Follow-Up: As needed        Signed, Fransico Him, MD  02/01/2018 9:06 AM    [address], [place], [place]  [address] Phone: [phone]; Fax: [phone]

## 2018-02-04 DIAGNOSIS — K760 Fatty (change of) liver, not elsewhere classified: Secondary | ICD-10-CM | POA: Diagnosis not present

## 2018-02-12 DIAGNOSIS — J111 Influenza due to unidentified influenza virus with other respiratory manifestations: Secondary | ICD-10-CM | POA: Diagnosis not present

## 2018-02-18 ENCOUNTER — Telehealth (HOSPITAL_COMMUNITY): Payer: Self-pay

## 2018-02-18 DIAGNOSIS — G5601 Carpal tunnel syndrome, right upper limb: Secondary | ICD-10-CM | POA: Diagnosis not present

## 2018-02-18 DIAGNOSIS — E6609 Other obesity due to excess calories: Secondary | ICD-10-CM | POA: Diagnosis not present

## 2018-02-18 DIAGNOSIS — K5900 Constipation, unspecified: Secondary | ICD-10-CM | POA: Diagnosis not present

## 2018-02-18 NOTE — Telephone Encounter (Signed)
Pt contacted and instructions given. Pt stated that she would be here for her test. S.Ronette Hank EMTP

## 2018-02-21 ENCOUNTER — Ambulatory Visit (HOSPITAL_COMMUNITY): Payer: 59 | Attending: Cardiovascular Disease

## 2018-02-21 ENCOUNTER — Ambulatory Visit (HOSPITAL_BASED_OUTPATIENT_CLINIC_OR_DEPARTMENT_OTHER): Payer: 59

## 2018-02-21 ENCOUNTER — Telehealth: Payer: Self-pay

## 2018-02-21 ENCOUNTER — Ambulatory Visit (INDEPENDENT_AMBULATORY_CARE_PROVIDER_SITE_OTHER): Payer: 59

## 2018-02-21 ENCOUNTER — Other Ambulatory Visit: Payer: Self-pay

## 2018-02-21 DIAGNOSIS — R0602 Shortness of breath: Secondary | ICD-10-CM | POA: Insufficient documentation

## 2018-02-21 DIAGNOSIS — R002 Palpitations: Secondary | ICD-10-CM

## 2018-02-21 DIAGNOSIS — R079 Chest pain, unspecified: Secondary | ICD-10-CM | POA: Diagnosis present

## 2018-02-21 LAB — MYOCARDIAL PERFUSION IMAGING
CHL CUP NUCLEAR SRS: 0
CHL CUP NUCLEAR SSS: 6
LVDIAVOL: 79 mL (ref 46–106)
LVSYSVOL: 32 mL
Peak HR: 107 {beats}/min
Rest HR: 85 {beats}/min
SDS: 6
TID: 1.11

## 2018-02-21 LAB — ECHOCARDIOGRAM COMPLETE
Height: 66 in
WEIGHTICAEL: 3088 [oz_av]

## 2018-02-21 MED ORDER — TECHNETIUM TC 99M TETROFOSMIN IV KIT
32.4000 | PACK | Freq: Once | INTRAVENOUS | Status: AC | PRN
  Administered 2018-02-21: 32.4 via INTRAVENOUS
  Filled 2018-02-21: qty 33

## 2018-02-21 MED ORDER — TECHNETIUM TC 99M TETROFOSMIN IV KIT
10.4000 | PACK | Freq: Once | INTRAVENOUS | Status: AC | PRN
  Administered 2018-02-21: 10.4 via INTRAVENOUS
  Filled 2018-02-21: qty 11

## 2018-02-21 MED ORDER — REGADENOSON 0.4 MG/5ML IV SOLN
0.4000 mg | Freq: Once | INTRAVENOUS | Status: AC
  Administered 2018-02-21: 0.4 mg via INTRAVENOUS

## 2018-02-21 NOTE — Telephone Encounter (Signed)
Notes recorded by Frederik Schmidt, RN on 02/21/2018 at 10:49 AM EST The patient has been notified of the result and verbalized understanding. All questions (if any) were answered. Frederik Schmidt, RN 02/21/2018 10:49 AM

## 2018-02-21 NOTE — Telephone Encounter (Signed)
-----   Message from Dorothy Spark, MD sent at 02/21/2018 10:17 AM EST ----- - Normal biventricular function, LVEF 60-65%. No hemodynamically   significant valvular heart disease. Normal echocardiogram.

## 2018-02-25 ENCOUNTER — Telehealth: Payer: Self-pay | Admitting: *Deleted

## 2018-02-25 NOTE — Telephone Encounter (Signed)
-----   Message from Dorothy Spark, MD sent at 02/21/2018  3:47 PM EST -----  The study is normal.  This is a low risk study.  The left ventricular ejection fraction is normal (55-65%).

## 2018-02-25 NOTE — Telephone Encounter (Signed)
Pt has been notified of Myoview results by phone with verbal understanding. See phone note for further notes. Pt states to me that she has been having problems with her monitor stickers and not connecting to the cell phone. I advised pt to try and call the 800 # for the monitor company though I will also have Markus Daft, Monitor Tech call her as well. Pt states she has tried to replace the stickers a couple of times though she cannot get the cell connection to work again. Pt thanked me for the call.

## 2018-02-28 DIAGNOSIS — E271 Primary adrenocortical insufficiency: Secondary | ICD-10-CM | POA: Diagnosis not present

## 2018-02-28 DIAGNOSIS — G894 Chronic pain syndrome: Secondary | ICD-10-CM | POA: Diagnosis not present

## 2018-02-28 DIAGNOSIS — M722 Plantar fascial fibromatosis: Secondary | ICD-10-CM | POA: Diagnosis not present

## 2018-03-12 DIAGNOSIS — M79671 Pain in right foot: Secondary | ICD-10-CM | POA: Insufficient documentation

## 2018-03-12 DIAGNOSIS — M722 Plantar fascial fibromatosis: Secondary | ICD-10-CM | POA: Diagnosis not present

## 2018-03-12 DIAGNOSIS — G5601 Carpal tunnel syndrome, right upper limb: Secondary | ICD-10-CM | POA: Diagnosis not present

## 2018-03-18 ENCOUNTER — Encounter: Payer: Self-pay | Admitting: Endocrinology

## 2018-03-25 ENCOUNTER — Encounter: Payer: Self-pay | Admitting: Orthopedic Surgery

## 2018-03-25 ENCOUNTER — Ambulatory Visit: Payer: 59 | Admitting: Orthopedic Surgery

## 2018-04-03 DIAGNOSIS — G894 Chronic pain syndrome: Secondary | ICD-10-CM | POA: Diagnosis not present

## 2018-04-03 DIAGNOSIS — I10 Essential (primary) hypertension: Secondary | ICD-10-CM | POA: Diagnosis not present

## 2018-04-08 ENCOUNTER — Telehealth: Payer: Self-pay

## 2018-04-08 NOTE — Telephone Encounter (Signed)
Spoke with the patient, she stated that the test would be $200. She wore her daughter's apple watch for a little bit and noticed that her heart rate rarely drops below 109 HR. She wanted to know if she could just wear the apple watch, instead of the holter. Sending to Dr. Radford Pax.

## 2018-04-08 NOTE — Telephone Encounter (Signed)
-----   Message from Sueanne Margarita, MD sent at 04/04/2018 12:58 PM EST ----- Please get a 24 hour heart monitor to assess average heart rate due to persistent tachycardia on event mointor  Traci TUrner ----- Message ----- From: Sarina Ill, RN Sent: 04/04/2018  12:53 PM EST To: Sueanne Margarita, MD  Spoke with Joellen Jersey, she stated that her insurance did not cover telemetry, so she was unable to obtain and average HR.

## 2018-04-08 NOTE — Telephone Encounter (Signed)
If she can get an average HR over 24 hours from her Apple iwatch I am fine with that and have her let us know the results

## 2018-04-08 NOTE — Telephone Encounter (Signed)
Notes recorded by Sarina Ill, RN on 04/08/2018 at 1:44 PM EST Spoke with the patient, she is going to call her insurance and determine what the price of a holter monitor. ------  Notes recorded by Sarina Ill, RN on 04/08/2018 at 1:43 PM EST Please get a 24 hour heart monitor to assess average heart rate due to persistent tachycardia on event mointor   Traci TUrner ------  Notes recorded by Sarina Ill, RN on 04/04/2018 at 12:53 PM EST Spoke with Joellen Jersey, she stated that her insurance did not cover telemetry, so she was unable to obtain and average HR. ------  Notes recorded by Sueanne Margarita, MD on 04/01/2018 at 8:01 PM EST Please find out from [facility] if we can get an average heart rate from this type of monitor

## 2018-04-09 NOTE — Telephone Encounter (Signed)
Spoke with the patient, she stated that she was going to download an app that tracks her HR and she will contact the office once she gets the results.

## 2018-04-29 NOTE — Telephone Encounter (Signed)
Left a message to call back.

## 2018-05-02 NOTE — Telephone Encounter (Signed)
Spoke with the patient, she stated she has average heart rate for the day, but she is going to have her husband set up the watch this weekend to track. She will call on Monday with the results.

## 2018-05-09 DIAGNOSIS — G894 Chronic pain syndrome: Secondary | ICD-10-CM | POA: Diagnosis not present

## 2018-05-09 DIAGNOSIS — Z6830 Body mass index (BMI) 30.0-30.9, adult: Secondary | ICD-10-CM | POA: Diagnosis not present

## 2018-05-09 DIAGNOSIS — I10 Essential (primary) hypertension: Secondary | ICD-10-CM | POA: Diagnosis not present

## 2018-05-17 NOTE — Telephone Encounter (Signed)
Spoke with the patient, she stated her heart rate average is usually around 90s. Today, 05/17/18, her resting was 88, walking: 112 and currently she has been sitting for awhile and it is 97.

## 2018-05-18 NOTE — Telephone Encounter (Signed)
That is fine.  Please encouraged her to  Limit her caffeine intake

## 2018-05-20 NOTE — Telephone Encounter (Signed)
Spoke with the patient, she expressed understanding and had no further questions.

## 2018-05-27 ENCOUNTER — Telehealth: Payer: Self-pay | Admitting: Orthopedic Surgery

## 2018-05-27 NOTE — Telephone Encounter (Signed)
01/27/20Per Ihor Austin "Patient was a No show for the visit 03/25/18, Dr Aline Brochure. Letter sent to patient"

## 2018-09-23 ENCOUNTER — Other Ambulatory Visit: Payer: Self-pay

## 2018-09-23 ENCOUNTER — Other Ambulatory Visit (HOSPITAL_COMMUNITY)
Admission: RE | Admit: 2018-09-23 | Discharge: 2018-09-23 | Disposition: A | Discharge status: Home / Self Care | Payer: 59 | Source: Ambulatory Visit | Attending: Adult Health | Admitting: Adult Health

## 2018-09-23 ENCOUNTER — Ambulatory Visit (INDEPENDENT_AMBULATORY_CARE_PROVIDER_SITE_OTHER): Payer: 59 | Admitting: Adult Health

## 2018-09-23 ENCOUNTER — Encounter: Payer: Self-pay | Admitting: Adult Health

## 2018-09-23 VITALS — BP 121/71 | HR 80 | Ht 66.0 in | Wt 183.0 lb

## 2018-09-23 DIAGNOSIS — Z1212 Encounter for screening for malignant neoplasm of rectum: Secondary | ICD-10-CM

## 2018-09-23 DIAGNOSIS — Z01419 Encounter for gynecological examination (general) (routine) without abnormal findings: Secondary | ICD-10-CM

## 2018-09-23 DIAGNOSIS — Z1211 Encounter for screening for malignant neoplasm of colon: Secondary | ICD-10-CM | POA: Insufficient documentation

## 2018-09-23 DIAGNOSIS — Z975 Presence of (intrauterine) contraceptive device: Secondary | ICD-10-CM

## 2018-09-23 LAB — HEMOCCULT GUIAC POC 1CARD (OFFICE): Fecal Occult Blood, POC: NEGATIVE

## 2018-09-23 NOTE — Addendum Note (Signed)
Addended by: Diona Fanti A on: 09/23/2018 01:59 PM   Modules accepted: Orders

## 2018-09-23 NOTE — Progress Notes (Signed)
Patient ID: Gloria Acosta, female   DOB: Aug 11, 1975, 43 y.o.   MRN: [id number] History of Present Illness: Gloria Acosta is a 43 year old white female married,G2P1, in for a well woman gyn exam and pap. PCP is Dr Gerarda Fraction.    Current Medications, Allergies, Past Medical History, Past Surgical History, Family History and Social History were reviewed in [facility] record.     Review of Systems:  Patient denies any headaches, hearing loss, fatigue, blurred vision, shortness of breath, chest pain, abdominal pain, problems with bowel movements, urination, or intercourse(has pain at times, but not having sex often decreased libido). No joint pain or mood swings. +hot flashes  +weight gain She is on prednisone for Addison's  Physical Exam:BP 121/71 (BP Location: Left Arm, Patient Position: Sitting, Cuff Size: Normal)   Pulse 80   Ht 5\' 6"  (1.676 m)   Wt 183 lb (83 kg)   BMI 29.54 kg/m  General:  Well developed, well nourished, no acute distress Skin:  Warm and dry Neck:  Midline trachea, normal thyroid, good ROM, no lymphadenopathy Lungs; Clear to auscultation bilaterally Breast:  No dominant palpable mass, retraction, or nipple discharge Cardiovascular: Regular rate and rhythm Abdomen:  Soft, non tender, no hepatosplenomegaly Pelvic:  External genitalia is normal in appearance, no lesions.  The vagina is normal in appearance. Urethra has no lesions or masses. The cervix is bulbous.+IUD strings, pap with HPV 16/18 genotyping performed.   Uterus is felt to be normal size, shape, and contour.  No adnexal masses or tenderness noted.Bladder is non tender, no masses felt. Rectal: Good sphincter tone, no polyps, or hemorrhoids felt.  Hemoccult negative. Extremities/musculoskeletal:  No swelling or varicosities noted, no clubbing or cyanosis Psych:  No mood changes, alert and cooperative,seems happy Fall risk is low PHQ 9 score is 0.  Examination chaperoned by Diona Fanti CMA.     Impression: 1. Encounter for gynecological examination with Papanicolaou smear of cervix   2. Screening for colorectal cancer   3. IUD (intrauterine device) in place       Plan: Use good lubricate Increase frequency of intercourse Review handout on perimenopause,it was discussed   Physical in 1 year Pap in 3 if normal Get mammogram  Labs with PCP

## 2018-09-23 NOTE — Patient Instructions (Signed)

## 2018-09-26 LAB — CYTOLOGY - PAP
Adequacy: ABSENT
Diagnosis: NEGATIVE
HPV: NOT DETECTED

## 2018-10-01 ENCOUNTER — Other Ambulatory Visit: Payer: 59 | Admitting: Adult Health

## 2018-12-24 ENCOUNTER — Other Ambulatory Visit: Payer: Self-pay

## 2018-12-24 DIAGNOSIS — Z20822 Contact with and (suspected) exposure to covid-19: Secondary | ICD-10-CM

## 2018-12-26 LAB — NOVEL CORONAVIRUS, NAA: SARS-CoV-2, NAA: NOT DETECTED

## 2019-01-21 ENCOUNTER — Other Ambulatory Visit: Payer: Self-pay

## 2019-01-21 DIAGNOSIS — Z20822 Contact with and (suspected) exposure to covid-19: Secondary | ICD-10-CM

## 2019-01-22 LAB — NOVEL CORONAVIRUS, NAA: SARS-CoV-2, NAA: NOT DETECTED

## 2019-10-14 ENCOUNTER — Ambulatory Visit (HOSPITAL_COMMUNITY)
Admission: RE | Admit: 2019-10-14 | Discharge: 2019-10-14 | Disposition: A | Discharge status: Home / Self Care | Payer: 59 | Source: Ambulatory Visit | Attending: Pulmonary Disease | Admitting: Pulmonary Disease

## 2019-10-14 ENCOUNTER — Other Ambulatory Visit: Payer: Self-pay | Admitting: Adult Health

## 2019-10-14 DIAGNOSIS — U071 COVID-19: Secondary | ICD-10-CM

## 2019-10-14 MED ORDER — METHYLPREDNISOLONE SODIUM SUCC 125 MG IJ SOLR: 125.0000 mg | Freq: Once | INTRAMUSCULAR | Status: DC | PRN

## 2019-10-14 MED ORDER — DIPHENHYDRAMINE HCL 50 MG/ML IJ SOLN: 50.0000 mg | Freq: Once | INTRAMUSCULAR | Status: DC | PRN

## 2019-10-14 MED ORDER — ALBUTEROL SULFATE HFA 108 (90 BASE) MCG/ACT IN AERS: 2.0000 | INHALATION_SPRAY | Freq: Once | RESPIRATORY_TRACT | Status: DC | PRN

## 2019-10-14 MED ORDER — FAMOTIDINE IN NACL 20-0.9 MG/50ML-% IV SOLN: 20.0000 mg | Freq: Once | INTRAVENOUS | Status: DC | PRN

## 2019-10-14 MED ORDER — SODIUM CHLORIDE 0.9 % IV SOLN: INTRAVENOUS | Status: DC | PRN

## 2019-10-14 MED ORDER — EPINEPHRINE 0.3 MG/0.3ML IJ SOAJ: 0.3000 mg | Freq: Once | INTRAMUSCULAR | Status: DC | PRN

## 2019-10-14 MED ORDER — SODIUM CHLORIDE 0.9 % IV SOLN
1200.0000 mg | Freq: Once | INTRAVENOUS | Status: AC
  Administered 2019-10-14: 1200 mg via INTRAVENOUS
  Filled 2019-10-14: qty 10

## 2019-10-14 MED ORDER — ACETAMINOPHEN 325 MG PO TABS
650.0000 mg | ORAL_TABLET | Freq: Once | ORAL | Status: AC
  Administered 2019-10-14: 650 mg via ORAL
  Filled 2019-10-14: qty 2

## 2019-10-14 MED ORDER — ONDANSETRON HCL 4 MG/2ML IJ SOLN
4.0000 mg | Freq: Once | INTRAMUSCULAR | Status: AC
  Administered 2019-10-14: 4 mg via INTRAVENOUS
  Filled 2019-10-14: qty 2

## 2019-10-14 NOTE — Progress Notes (Signed)
°  Diagnosis: COVID-19 ° °Physician:Dr Patrick Wright ° °Procedure: [facility] Med: casirivimab\imdevimab infusion - Provided patient with casirivimab\imdevimab fact sheet for patients, parents and caregivers prior to infusion. ° °Complications: No immediate complications noted. ° °Discharge: Discharged home  ° °Gloria Acosta °10/14/2019 ° °

## 2019-10-14 NOTE — Progress Notes (Signed)
I connected by phone with Gloria Acosta on 10/14/2019 at 1:35 PM to discuss the potential use of a new treatment for mild to moderate COVID-19 viral infection in non-hospitalized patients.  This patient is a 44 y.o. female that meets the FDA criteria for Emergency Use Authorization of COVID monoclonal antibody casirivimab/imdevimab.  Has a (+) direct SARS-CoV-2 viral test result  Has mild or moderate COVID-19   Is NOT hospitalized due to COVID-19  Is within 10 days of symptom onset  Has at least one of the high risk factor(s) for progression to severe COVID-19 and/or hospitalization as defined in EUA.  Specific high risk criteria : Immunosuppressive Disease or Treatment   I have spoken and communicated the following to the patient or parent/caregiver regarding COVID monoclonal antibody treatment:  1. FDA has authorized the emergency use for the treatment of mild to moderate COVID-19 in adults and pediatric patients with positive results of direct SARS-CoV-2 viral testing who are 34 years of age and older weighing at least 40 kg, and who are at high risk for progressing to severe COVID-19 and/or hospitalization.  2. The significant known and potential risks and benefits of COVID monoclonal antibody, and the extent to which such potential risks and benefits are unknown.  3. Information on available alternative treatments and the risks and benefits of those alternatives, including clinical trials.  4. Patients treated with COVID monoclonal antibody should continue to self-isolate and use infection control measures (e.g., wear mask, isolate, social distance, avoid sharing personal items, clean and disinfect "high touch" surfaces, and frequent handwashing) according to CDC guidelines.   5. The patient or parent/caregiver has the option to accept or refuse COVID monoclonal antibody treatment.  After reviewing this information with the patient, The patient agreed to proceed with receiving  casirivimab\imdevimab infusion and will be provided a copy of the Fact sheet prior to receiving the infusion. Scot Dock 10/14/2019 1:35 PM

## 2019-10-14 NOTE — Discharge Instructions (Signed)

## 2019-10-29 HISTORY — PX: NECK SURGERY: SHX720

## 2019-11-05 ENCOUNTER — Other Ambulatory Visit (HOSPITAL_COMMUNITY): Payer: Self-pay | Admitting: Internal Medicine

## 2019-11-05 DIAGNOSIS — R0600 Dyspnea, unspecified: Secondary | ICD-10-CM

## 2019-11-06 ENCOUNTER — Ambulatory Visit (HOSPITAL_COMMUNITY)
Admission: RE | Admit: 2019-11-06 | Discharge: 2019-11-06 | Disposition: A | Discharge status: Home / Self Care | Payer: 59 | Source: Ambulatory Visit | Attending: Internal Medicine | Admitting: Internal Medicine

## 2019-11-06 ENCOUNTER — Other Ambulatory Visit: Payer: Self-pay

## 2019-11-06 DIAGNOSIS — R0600 Dyspnea, unspecified: Secondary | ICD-10-CM | POA: Diagnosis not present

## 2019-11-17 ENCOUNTER — Other Ambulatory Visit: Payer: Self-pay

## 2019-11-17 ENCOUNTER — Ambulatory Visit: Payer: 59 | Admitting: Internal Medicine

## 2019-11-17 ENCOUNTER — Encounter: Payer: Self-pay | Admitting: Internal Medicine

## 2019-11-17 VITALS — BP 140/88 | HR 87 | Temp 97.4°F | Resp 16 | Ht 66.0 in | Wt 178.4 lb

## 2019-11-17 DIAGNOSIS — U071 COVID-19: Secondary | ICD-10-CM

## 2019-11-17 DIAGNOSIS — J849 Interstitial pulmonary disease, unspecified: Secondary | ICD-10-CM | POA: Diagnosis not present

## 2019-11-17 DIAGNOSIS — G4719 Other hypersomnia: Secondary | ICD-10-CM

## 2019-11-17 DIAGNOSIS — R0602 Shortness of breath: Secondary | ICD-10-CM

## 2019-11-17 DIAGNOSIS — R011 Cardiac murmur, unspecified: Secondary | ICD-10-CM

## 2019-11-17 NOTE — Progress Notes (Signed)
[facility] [place], [place] [address]  Pulmonary Sleep Medicine   Office Visit Note  Patient Name: Gloria Acosta DOB: 1975/06/19 MRN [id number]  Date of Service: 11/17/2019  Complaints/HPI: had covid 19 August 16. She was not admitted but was apparently very sick but did not go to the hospital. She had a CXR done on 9/9 which reveals an interstital patter. She has not had a CT scan done. She is SOB on minimal exertion. She also feels pressure in her chest. She has not had any workup from a cardiac perspective. No prior history of heart disease. No prior lung disease. She states she did smoke. Not heavy. She quit 8 years ago. She does not smoke marijuan and does not vape. She works as an Radio broadcast assistant and a Conservator, museum/gallery. She states she is not able to go uphill without getting SOB. She has noted any activity causes increased SOB. She also has noted that she feels tired frequently has noted napping at times. Patient does have history of addisons disease.So she has been thinking her fatigue is related to that. Patient states she feels tired in am and feels tired all day  ROS  General: (-) fever, (-) chills, (-) night sweats, (-) weakness Skin: (-) rashes, (-) itching,. Eyes: (-) visual changes, (-) redness, (-) itching. Nose and Sinuses: (-) nasal stuffiness or itchiness, (-) postnasal drip, (-) nosebleeds, (-) sinus trouble. Mouth and Throat: (-) sore throat, (-) hoarseness. Neck: (-) swollen glands, (-) enlarged thyroid, (-) neck pain. Respiratory: + cough, (-) bloody sputum, + shortness of breath, + wheezing. Cardiovascular: - ankle swelling, (-) chest pain. Lymphatic: (-) lymph node enlargement. Neurologic: (-) numbness, (-) tingling. Psychiatric: (-) anxiety, (-) depression   Current Medication: Outpatient Encounter Medications as of 11/17/2019  Medication Sig  . amphetamine-dextroamphetamine (ADDERALL) 10 MG tablet Take 10 mg by mouth 2 (two)  times daily with a meal.  . HYDROcodone-acetaminophen (NORCO) 10-325 MG per tablet Take 1 tablet by mouth as needed.   . Levonorgestrel (KYLEENA) 19.5 MG IUD by Intrauterine route.  . Multiple Vitamin (MULTIVITAMIN) tablet Take 1 tablet by mouth daily.  . predniSONE (DELTASONE) 20 MG tablet Take 20 mg by mouth 2 (two) times daily with a meal.  . [DISCONTINUED] predniSONE (DELTASONE) 5 MG tablet Take 5 mg by mouth daily with breakfast. (Patient not taking: Reported on 11/17/2019)   No facility-administered encounter medications on file as of 11/17/2019.    Surgical History: Past Surgical History:  Procedure Laterality Date  . BREAST BIOPSY Right   . CESAREAN SECTION    . CHOLECYSTECTOMY    . ECTOPIC PREGNANCY SURGERY    . IRRIGATION AND DEBRIDEMENT ABSCESS Right 01/09/2017   Procedure: MINOR INCISION AND DRAINAGE OF ABSCESS RIGHT RING FINGER;  Surgeon: Carole Civil, MD;  Location: [facility];  Service: Orthopedics;  Laterality: Right;  . OVARIAN CYST REMOVAL    . TUBAL LIGATION      Medical History: Past Medical History:  Diagnosis Date  . Addison's disease ([place])   . Arthritis    in back  . Back pain    2 bulging disc,DJD  . Bronchitis   . Constipation 07/29/2014  . IUD (intrauterine device) in place 03/18/2013   Seen on [place]  . Migraines   . Pelvic pain in female 07/29/2014  . Right ovarian cyst 07/29/2014    Family History: Family History  Problem Relation Age of Onset  . Diabetes Mother   . Hypertension  Mother   . Breast cancer Mother   . Cancer Father        brain and liver pancreas  . Hypertension Father   . Cancer Maternal Aunt   . Cancer Paternal Aunt        breast  . Cancer Maternal Grandmother        ovarian   . Stroke Maternal Grandfather   . Diabetes Paternal Grandfather   . Heart attack Paternal Grandfather     Social History: Social History   Socioeconomic History  . Marital status: Married    Spouse name: Not on file  . Number of children: 1  .  Years of education: Not on file  . Highest education level: Not on file  Occupational History  . Not on file  Tobacco Use  . Smoking status: Former Smoker    Packs/day: 0.00    Years: 21.00    Pack years: 0.00    Types: Cigarettes  . Smokeless tobacco: Never Used  Vaping Use  . Vaping Use: Never used  Substance and Sexual Activity  . Alcohol use: Yes    Comment: occ. glass of wine  . Drug use: No  . Sexual activity: Yes    Birth control/protection: I.U.D.  Other Topics Concern  . Not on file  Social History Narrative  . Not on file   Social Determinants of Health   Financial Resource Strain:   . Difficulty of Paying Living Expenses: Not on file  Food Insecurity:   . Worried About Charity fundraiser in the Last Year: Not on file  . Ran Out of Food in the Last Year: Not on file  Transportation Needs:   . Lack of Transportation (Medical): Not on file  . Lack of Transportation (Non-Medical): Not on file  Physical Activity:   . Days of Exercise per Week: Not on file  . Minutes of Exercise per Session: Not on file  Stress:   . Feeling of Stress : Not on file  Social Connections:   . Frequency of Communication with Friends and Family: Not on file  . Frequency of Social Gatherings with Friends and Family: Not on file  . Attends Religious Services: Not on file  . Active Member of Clubs or Organizations: Not on file  . Attends Archivist Meetings: Not on file  . Marital Status: Not on file  Intimate Partner Violence:   . Fear of Current or Ex-Partner: Not on file  . Emotionally Abused: Not on file  . Physically Abused: Not on file  . Sexually Abused: Not on file    Vital Signs: Blood pressure 140/88, pulse 87, temperature (!) 97.4 F (36.3 C), resp. rate 16, height 5\' 6"  (1.676 m), weight 178 lb 6.4 oz (80.9 kg), SpO2 99 %.  Examination: General Appearance: The patient is well-developed, well-nourished, and in no distress. Skin: Gross inspection of skin  unremarkable. Head: normocephalic, no gross deformities. Eyes: no gross deformities noted. ENT: ears appear grossly normal no exudates. Neck: Supple. No thyromegaly. No LAD. Respiratory: few rhonchi noted. Cardiovascular: Normal S1 and S2 without murmur or rub. Extremities: No cyanosis. pulses are equal. Neurologic: Alert and oriented. No involuntary movements.  LABS: No results found for this or any previous visit (from the past 2160 hour(s)).  Radiology: DG Chest 2 View  Result Date: 11/06/2019 CLINICAL DATA:  Dyspnea, post COVID, persistent shortness of breath EXAM: CHEST - 2 VIEW COMPARISON:  None. FINDINGS: The heart size and mediastinal contours are  within normal limits. There is some generally irregular and coarse appearing diffuse interstitial airspace opacity throughout the lungs. The visualized skeletal structures are unremarkable. IMPRESSION: There is some generally irregular and coarse appearing diffuse interstitial airspace opacity throughout the lungs, in keeping with residua of COVID-19 airspace disease. No focal airspace opacity. Consider ILD protocol CT to further evaluate for post infectious/inflammatory fibrosis. Electronically Signed   By: Eddie Candle M.D.   On: 11/06/2019 17:08    No results found.  DG Chest 2 View  Result Date: 11/06/2019 CLINICAL DATA:  Dyspnea, post COVID, persistent shortness of breath EXAM: CHEST - 2 VIEW COMPARISON:  None. FINDINGS: The heart size and mediastinal contours are within normal limits. There is some generally irregular and coarse appearing diffuse interstitial airspace opacity throughout the lungs. The visualized skeletal structures are unremarkable. IMPRESSION: There is some generally irregular and coarse appearing diffuse interstitial airspace opacity throughout the lungs, in keeping with residua of COVID-19 airspace disease. No focal airspace opacity. Consider ILD protocol CT to further evaluate for post infectious/inflammatory  fibrosis. Electronically Signed   By: Eddie Candle M.D.   On: 11/06/2019 17:08      Assessment and Plan: Patient Active Problem List   Diagnosis Date Noted  . Screening for colorectal cancer 09/23/2018  . Encounter for gynecological examination with Papanicolaou smear of cervix 09/23/2018  . Chest pain 01/31/2018  . Palpitations 01/31/2018  . Paronychia of right ring finger I&D 01/09/17   . Pelvic pain in female 07/29/2014  . Right ovarian cyst 07/29/2014  . Constipation 07/29/2014  . IUD (intrauterine device) in place 03/18/2013    1. ILD post Covid will get a CT chest with high resolution 2. SOB patient will need PFT 3. Covid 19 now in recovery with residual cahnges noted on the CXR 4. Hypersomnia she does have hypersomnia with sleepiness during the daytime and also has cardiac murmer and post Covid will get a sleep study done 5. Murmer she has a murmer noted on the right sternal boarder with findings os SOB should have an echo  General Counseling: I have discussed the findings of the evaluation and examination with Gloria Acosta.  I have also discussed any further diagnostic evaluation thatmay be needed or ordered today. Gloria Acosta verbalizes understanding of the findings of todays visit. We also reviewed her medications today and discussed drug interactions and side effects including but not limited excessive drowsiness and altered mental states. We also discussed that there is always a risk not just to her but also people around her. she has been encouraged to call the office with any questions or concerns that should arise related to todays visit.  Orders Placed This Encounter  Procedures  . CT Chest High Resolution    Standing Status:   Future    Standing Expiration Date:   11/16/2020    Order Specific Question:   Is patient pregnant?    Answer:   No    Order Specific Question:   Preferred imaging location?    Answer:   [facility]    Order Specific Question:   Radiology Contrast  Protocol - do NOT remove file path    Answer:   [email or web address]  . ECHOCARDIOGRAM COMPLETE    Standing Status:   Future    Standing Expiration Date:   11/16/2020    Order Specific Question:   Where should this test be performed    Answer:   External    Order Specific Question:   Perflutren DEFINITY (image  enhancing agent) should be administered unless hypersensitivity or allergy exist    Answer:   Administer Perflutren    Order Specific Question:   Reason for exam-Echo    Answer:   Dyspnea  786.09 / R06.00  . Pulmonary function test    Standing Status:   Future    Standing Expiration Date:   11/16/2020    Order Specific Question:   Where should this test be performed?    Answer:   [facility]  . PSG Sleep Study    Standing Status:   Future    Standing Expiration Date:   11/16/2020    Order Specific Question:   Where should this test be performed:    Answer:   [facility]     Time spent: 11min  I have personally obtained a history, examined the patient, evaluated laboratory and imaging results, formulated the assessment and plan and placed orders.    Allyne Gee, MD [facility] Pulmonary and Critical Care Sleep medicine

## 2019-11-17 NOTE — Patient Instructions (Signed)
Sleep Apnea Sleep apnea affects breathing during sleep. It causes breathing to stop for a short time or to become shallow. It can also increase the risk of:  Heart attack.  Stroke.  Being very overweight (obese).  Diabetes.  Heart failure.  Irregular heartbeat. The goal of treatment is to help you breathe normally again. What are the causes? There are three kinds of sleep apnea:  Obstructive sleep apnea. This is caused by a blocked or collapsed airway.  Central sleep apnea. This happens when the brain does not send the right signals to the muscles that control breathing.  Mixed sleep apnea. This is a combination of obstructive and central sleep apnea. The most common cause of this condition is a collapsed or blocked airway. This can happen if:  Your throat muscles are too relaxed.  Your tongue and tonsils are too large.  You are overweight.  Your airway is too small. What increases the risk?  Being overweight.  Smoking.  Having a small airway.  Being older.  Being female.  Drinking alcohol.  Taking medicines to calm yourself (sedatives or tranquilizers).  Having family members with the condition. What are the signs or symptoms?  Trouble staying asleep.  Being sleepy or tired during the day.  Getting angry a lot.  Loud snoring.  Headaches in the morning.  Not being able to focus your mind (concentrate).  Forgetting things.  Less interest in sex.  Mood swings.  Personality changes.  Feelings of sadness (depression).  Waking up a lot during the night to pee (urinate).  Dry mouth.  Sore throat. How is this diagnosed?  Your medical history.  A physical exam.  A test that is done when you are sleeping (sleep study). The test is most often done in a sleep lab but may also be done at home. How is this treated?   Sleeping on your side.  Using a medicine to get rid of mucus in your nose (decongestant).  Avoiding the use of alcohol,  medicines to help you relax, or certain pain medicines (narcotics).  Losing weight, if needed.  Changing your diet.  Not smoking.  Using a machine to open your airway while you sleep, such as: ? An oral appliance. This is a mouthpiece that shifts your lower jaw forward. ? A CPAP device. This device blows air through a mask when you breathe out (exhale). ? An EPAP device. This has valves that you put in each nostril. ? A BPAP device. This device blows air through a mask when you breathe in (inhale) and breathe out.  Having surgery if other treatments do not work. It is important to get treatment for sleep apnea. Without treatment, it can lead to:  High blood pressure.  Coronary artery disease.  In men, not being able to have an erection (impotence).  Reduced thinking ability. Follow these instructions at home: Lifestyle  Make changes that your doctor recommends.  Eat a healthy diet.  Lose weight if needed.  Avoid alcohol, medicines to help you relax, and some pain medicines.  Do not use any products that contain nicotine or tobacco, such as cigarettes, e-cigarettes, and chewing tobacco. If you need help quitting, ask your doctor. General instructions  Take over-the-counter and prescription medicines only as told by your doctor.  If you were given a machine to use while you sleep, use it only as told by your doctor.  If you are having surgery, make sure to tell your doctor you have sleep apnea. You   may need to bring your device with you.  Keep all follow-up visits as told by your doctor. This is important. Contact a doctor if:  The machine that you were given to use during sleep bothers you or does not seem to be working.  You do not get better.  You get worse. Get help right away if:  Your chest hurts.  You have trouble breathing in enough air.  You have an uncomfortable feeling in your back, arms, or stomach.  You have trouble talking.  One side of your  body feels weak.  A part of your face is hanging down. These symptoms may be an emergency. Do not wait to see if the symptoms will go away. Get medical help right away. Call your local emergency services (911 in the [place] Do not drive yourself to the hospital. Summary  This condition affects breathing during sleep.  The most common cause is a collapsed or blocked airway.  The goal of treatment is to help you breathe normally while you sleep. This information is not intended to replace advice given to you by your health care provider. Make sure you discuss any questions you have with your health care provider. Document Revised: 11/30/2017 Document Reviewed: 10/09/2017 Elsevier Patient Education  [place]. Pulmonary Function Tests Pulmonary function tests (PFTs) are used to measure how well your lungs work, find out what is causing your lung problems, and figure out the best treatment for you. You may have PFTs:  When you have an illness involving the lungs.  To follow changes in your lung function over time if you have a chronic lung disease.  If you are an Nature conservation officer. This checks the effects of being exposed to chemicals over a long period of time.  To check lung function before having surgery or other procedures.  To check your lungs if you smoke.  To check if prescribed medicines or treatments are helping your lungs. Your results will be compared to the expected lung function of someone with healthy lungs who is similar to you in:  Age.  Gender.  Height.  Weight.  Race or ethnicity. This is done to show how your lungs compare to normal lung function (percent predicted). This is how your health care provider knows if your lung function is normal or not. If you have had PFTs done before, your health care provider will compare your current results with past results. This shows if your lung function is better, worse, or the same as before. Tell a health  care provider about:  Any allergies you have.  All medicines you are taking, including inhaler or nebulizer medicines, vitamins, herbs, eye drops, creams, and over-the-counter medicines.  Any blood disorders you have.  Any surgeries you have had, especially recent eye surgery, abdominal surgery, or chest surgery. These can make PFTs difficult or unsafe.  Any medical conditions you have, including chest pain or heart problems, tuberculosis, or respiratory infections such as pneumonia, a cold, or the flu.  Any fear of being in closed spaces (claustrophobia). Some of your tests may be in a closed space. What are the risks? Generally, this is a safe procedure. However, problems may occur, including:  Light-headedness due to over-breathing (hyperventilation).  An asthma attack from deep breathing.  A collapsed lung. What happens before the procedure?  Take over-the-counter and prescription medicines only as told by your health care provider. If you take inhaler or nebulizer medicines, ask your health care provider which medicines  you should take on the day of your testing. Some inhaler medicines may interfere with PFTs if they are taken shortly before the tests.  Follow your health care provider's instructions on eating and drinking restrictions. This may include avoiding eating large meals and drinking alcohol before the testing.  Do not use any products that contain nicotine or tobacco, such as cigarettes and e-cigarettes. If you need help quitting, ask your health care provider.  Wear comfortable clothing that will not interfere with breathing. What happens during the procedure?   You will be given a soft nose clip to wear. This is done so all of your breaths will go through your mouth instead of your nose.  You will be given a germ-free (sterile) mouthpiece. It will be attached to a machine that measures your breathing (spirometer).  You will be asked to do various breathing  maneuvers. The maneuvers will be done by breathing in (inhaling) and breathing out (exhaling). You may be asked to repeat the maneuvers several times before the testing is done.  It is important to follow the instructions exactly to get accurate results. Make sure to blow as hard and as fast as you can when you are told to do so.  You may be given a medicine that makes the small air passages in your lungs larger (bronchodilator) after testing has been done. This medicine will make it easier for you to breathe.  The tests will be repeated after the bronchodilator has taken effect.  You will be monitored carefully during the procedure for faintness, dizziness, trouble breathing, or any other problems. The procedure may vary among health care providers and hospitals. What happens after the procedure?  It is up to you to get your test results. Ask your health care provider, or the department that is doing the tests, when your results will be ready. After you have received your test results, talk with your health care provider about treatment options, if necessary. Summary  Pulmonary function tests (PFTs) are used to measure how well your lungs work, find out what is causing your lung problems, and figure out the best treatment for you.  Wear comfortable clothing that will not interfere with breathing.  It is up to you to get your test results. After you have received them, talk with your health care provider about treatment options, if necessary. This information is not intended to replace advice given to you by your health care provider. Make sure you discuss any questions you have with your health care provider. Document Revised: 02/11/2016 Document Reviewed: 01/06/2016 Elsevier Patient Education  [place]. Echocardiogram An echocardiogram is a procedure that uses painless sound waves (ultrasound) to produce an image of the heart. Images from an echocardiogram can provide important  information about:  Signs of coronary artery disease (CAD).  Aneurysm detection. An aneurysm is a weak or damaged part of an artery wall that bulges out from the normal force of blood pumping through the body.  Heart size and shape. Changes in the size or shape of the heart can be associated with certain conditions, including heart failure, aneurysm, and CAD.  Heart muscle function.  Heart valve function.  Signs of a past heart attack.  Fluid buildup around the heart.  Thickening of the heart muscle.  A tumor or infectious growth around the heart valves. Tell a health care provider about:  Any allergies you have.  All medicines you are taking, including vitamins, herbs, eye drops, creams, and over-the-counter medicines.  Any blood disorders you have.  Any surgeries you have had.  Any medical conditions you have.  Whether you are pregnant or may be pregnant. What are the risks? Generally, this is a safe procedure. However, problems may occur, including:  Allergic reaction to dye (contrast) that may be used during the procedure. What happens before the procedure? No specific preparation is needed. You may eat and drink normally. What happens during the procedure?   An IV tube may be inserted into one of your veins.  You may receive contrast through this tube. A contrast is an injection that improves the quality of the pictures from your heart.  A gel will be applied to your chest.  A wand-like tool (transducer) will be moved over your chest. The gel will help to transmit the sound waves from the transducer.  The sound waves will harmlessly bounce off of your heart to allow the heart images to be captured in real-time motion. The images will be recorded on a computer. The procedure may vary among health care providers and hospitals. What happens after the procedure?  You may return to your normal, everyday life, including diet, activities, and medicines, unless your  health care provider tells you not to do that. Summary  An echocardiogram is a procedure that uses painless sound waves (ultrasound) to produce an image of the heart.  Images from an echocardiogram can provide important information about the size and shape of your heart, heart muscle function, heart valve function, and fluid buildup around your heart.  You do not need to do anything to prepare before this procedure. You may eat and drink normally.  After the echocardiogram is completed, you may return to your normal, everyday life, unless your health care provider tells you not to do that. This information is not intended to replace advice given to you by your health care provider. Make sure you discuss any questions you have with your health care provider. Document Revised: 06/06/2018 Document Reviewed: 03/18/2016 Elsevier Patient Education  [place].

## 2019-11-26 ENCOUNTER — Other Ambulatory Visit: Payer: Self-pay | Admitting: Internal Medicine

## 2019-11-26 DIAGNOSIS — Z1231 Encounter for screening mammogram for malignant neoplasm of breast: Secondary | ICD-10-CM

## 2019-11-28 ENCOUNTER — Other Ambulatory Visit: Payer: Self-pay

## 2019-11-28 ENCOUNTER — Ambulatory Visit
Admission: RE | Admit: 2019-11-28 | Discharge: 2019-11-28 | Disposition: A | Discharge status: Home / Self Care | Payer: 59 | Source: Ambulatory Visit | Attending: Internal Medicine | Admitting: Internal Medicine

## 2019-11-28 DIAGNOSIS — J849 Interstitial pulmonary disease, unspecified: Secondary | ICD-10-CM | POA: Diagnosis not present

## 2019-12-01 ENCOUNTER — Ambulatory Visit
Admission: RE | Admit: 2019-12-01 | Discharge: 2019-12-01 | Disposition: A | Discharge status: Home / Self Care | Payer: 59 | Source: Ambulatory Visit | Attending: Internal Medicine | Admitting: Internal Medicine

## 2019-12-01 ENCOUNTER — Other Ambulatory Visit: Payer: Self-pay

## 2019-12-01 DIAGNOSIS — Z1231 Encounter for screening mammogram for malignant neoplasm of breast: Secondary | ICD-10-CM

## 2019-12-03 ENCOUNTER — Other Ambulatory Visit: Payer: Self-pay

## 2019-12-03 ENCOUNTER — Ambulatory Visit: Payer: 59

## 2019-12-03 DIAGNOSIS — R0602 Shortness of breath: Secondary | ICD-10-CM

## 2019-12-04 ENCOUNTER — Institutional Professional Consult (permissible substitution): Payer: 59 | Admitting: Pulmonary Disease

## 2019-12-08 ENCOUNTER — Telehealth: Payer: Self-pay

## 2019-12-08 NOTE — Telephone Encounter (Signed)
Confirmed and screened for 12-10-19 ov.

## 2019-12-10 ENCOUNTER — Other Ambulatory Visit: Payer: Self-pay

## 2019-12-10 ENCOUNTER — Ambulatory Visit: Payer: 59 | Admitting: Internal Medicine

## 2019-12-10 DIAGNOSIS — R0602 Shortness of breath: Secondary | ICD-10-CM

## 2019-12-10 LAB — PULMONARY FUNCTION TEST

## 2019-12-12 NOTE — Procedures (Signed)
Gloria Acosta, [id number]  DATE OF SERVICE: December 10, 2019  Complete Pulmonary Function Testing Interpretation:  FINDINGS:  The forced vital capacity is normal.  The FEV1 is normal.  Postbronchodilator no significant change in FEV1.  S1 FVC ratio is normal.  Total lung capacity is normal.  Residual volume is decreased.  Residual volume capacity ratio is decreased.  FRC is decreased.  DLCO is within normal limits.  IMPRESSION:  This pulmonary function study is within normal limits.  There was no change with bronchodilators but clinical improvement may still occur in the absence of spirometric improvement clinical correlation is recommended.  Allyne Gee, MD [facility] Pulmonary Critical Care Medicine Sleep Medicine

## 2019-12-22 ENCOUNTER — Other Ambulatory Visit: Payer: Self-pay

## 2019-12-22 ENCOUNTER — Encounter: Payer: Self-pay | Admitting: Internal Medicine

## 2019-12-22 ENCOUNTER — Ambulatory Visit (INDEPENDENT_AMBULATORY_CARE_PROVIDER_SITE_OTHER): Payer: 59 | Admitting: Internal Medicine

## 2019-12-22 VITALS — BP 142/90 | HR 87 | Temp 98.6°F | Resp 16 | Ht 65.0 in | Wt 176.8 lb

## 2019-12-22 DIAGNOSIS — Z23 Encounter for immunization: Secondary | ICD-10-CM | POA: Diagnosis not present

## 2019-12-22 DIAGNOSIS — G4719 Other hypersomnia: Secondary | ICD-10-CM

## 2019-12-22 DIAGNOSIS — U071 COVID-19: Secondary | ICD-10-CM | POA: Diagnosis not present

## 2019-12-22 DIAGNOSIS — R0602 Shortness of breath: Secondary | ICD-10-CM | POA: Diagnosis not present

## 2019-12-22 DIAGNOSIS — J849 Interstitial pulmonary disease, unspecified: Secondary | ICD-10-CM | POA: Diagnosis not present

## 2019-12-22 NOTE — Patient Instructions (Signed)
Nuclear Medicine Exam A nuclear medicine exam is a safe and painless imaging test. It helps your health care provider detect and diagnose diseases. It also provides information about the ways your organs work and how they are structured. For a nuclear medicine exam, you will be given a radioactive tracer. This substance is absorbed by your body's organs. A large scanning machine detects the tracer and creates pictures of the areas that your health care provider wants to know more about. There are several kinds of nuclear medicine exams. They include the following:  CT scan.  MRI scan.  PET scan.  SPECT scan. Tell your health care provider about:  Any allergies you have.  All medicines you are taking, including vitamins, herbs, eye drops, creams, and over-the-counter medicines.  Any problems you or family members have had with anesthetic medicines.  Any blood disorders you have.  Any surgeries you have had.  Any medical conditions you have.  Whether you are pregnant or may be pregnant.  Whether you are nursing. What are the risks? Generally, this is a safe procedure. However, problems may occur, such as an allergic reaction to the tracer, but this is rare. What happens before the procedure? Medicines Ask your health care provider about:  Changing or stopping your regular medicines. This is especially important if you are taking diabetes medicines or blood thinners.  Taking medicines such as aspirin and ibuprofen. These medicines can thin your blood. Do not take these medicines unless your health care provider tells you to take them.  Taking over-the-counter medicines, vitamins, herbs, and supplements. General instructions  Follow instructions from your health care provider about eating or drinking restrictions.  Do not wear jewelry.  Wear loose, comfortable clothing. You may be asked to wear a hospital gown for the procedure.  Bring previous imaging studies, such as  X-rays, with you to the exam if they are available. What happens during the procedure?   An IV may be inserted into one of your veins.  You will be asked to lie on a table or sit in a chair.  You will be given the radioactive tracer. You may get: ? A pill or liquid to swallow. ? An injection. ? Medicine through your IV. ? A gas to inhale.  A large scanning machine will be used to create images of your body. After the pictures are taken, you may have to wait so your health care provider can make sure that enough images were taken. The procedure may vary among health care providers and hospitals. What happens after the procedure?  You may go home after the procedure and return to your usual activities, unless your health care provider tells you otherwise.  Drink enough water to keep your urine pale yellow. This helps to remove the radioactive tracer from your body.  It is up to you to get the results of your procedure. Ask your health care provider, or the department that is doing the procedure, when your results will be ready.  Get help right away if you have problems breathing. Summary  A nuclear medicine exam is a safe and painless imaging test that provides information about how your organs are working. It is also used to detect and diagnose diseases of various body organs.  Follow your health care provider's instructions about eating and drinking restrictions. Ask whether you should change or stop any medicines.  During the procedure, you will be given a radioactive tracer. A large scanning machine will create images of  your body.  You may go home after the procedure and return to your regular activities. Follow your health care provider's instructions.  Get help right away if you have problems breathing. This information is not intended to replace advice given to you by your health care provider. Make sure you discuss any questions you have with your health care  provider. Document Revised: 01/02/2018 Document Reviewed: 01/02/2018 Elsevier Patient Education  [employer].

## 2019-12-22 NOTE — Progress Notes (Signed)
[facility] [place], [place] [address]  Pulmonary Sleep Medicine   Office Visit Note  Patient Name: Gloria Acosta DOB: Jun 18, 1975 MRN [id number]  Date of Service: 12/22/2019  Complaints/HPI: Post covid. She had covid in August has been having SOB noted. She had PFT that were normal. She had a CT chest done which shows GGO present through the lungs. Patient states she still has some SOB. She did not have the sleep study done. Patient also has been on steroids for Addisons. On the CT scan she had some CAD noted. She had a stress test done back in 2019 which was unremarkable. Since she has been having more SOB and negative findings on PFT would consider a repeat Stress  ROS  General: (-) fever, (-) chills, (-) night sweats, (-) weakness Skin: (-) rashes, (-) itching,. Eyes: (-) visual changes, (-) redness, (-) itching. Nose and Sinuses: (-) nasal stuffiness or itchiness, (-) postnasal drip, (-) nosebleeds, (-) sinus trouble. Mouth and Throat: (-) sore throat, (-) hoarseness. Neck: (-) swollen glands, (-) enlarged thyroid, (-) neck pain. Respiratory: + cough, (-) bloody sputum, + shortness of breath, - wheezing. Cardiovascular: - ankle swelling, (-) chest pain. Lymphatic: (-) lymph node enlargement. Neurologic: (-) numbness, (-) tingling. Psychiatric: (-) anxiety, (-) depression   Current Medication: Outpatient Encounter Medications as of 12/22/2019  Medication Sig  . amphetamine-dextroamphetamine (ADDERALL) 10 MG tablet Take 10 mg by mouth 2 (two) times daily with a meal.  . HYDROcodone-acetaminophen (NORCO) 10-325 MG per tablet Take 1 tablet by mouth as needed.   . Levonorgestrel (KYLEENA) 19.5 MG IUD by Intrauterine route.  . Multiple Vitamin (MULTIVITAMIN) tablet Take 1 tablet by mouth daily.  . predniSONE (DELTASONE) 20 MG tablet Take 20 mg by mouth 2 (two) times daily with a meal.   No facility-administered encounter medications on file as of  12/22/2019.    Surgical History: Past Surgical History:  Procedure Laterality Date  . BREAST BIOPSY Right   . CESAREAN SECTION    . CHOLECYSTECTOMY    . ECTOPIC PREGNANCY SURGERY    . IRRIGATION AND DEBRIDEMENT ABSCESS Right 01/09/2017   Procedure: MINOR INCISION AND DRAINAGE OF ABSCESS RIGHT RING FINGER;  Surgeon: Carole Civil, MD;  Location: [facility];  Service: Orthopedics;  Laterality: Right;  . OVARIAN CYST REMOVAL    . TUBAL LIGATION      Medical History: Past Medical History:  Diagnosis Date  . Addison's disease ([place])   . Arthritis    in back  . Back pain    2 bulging disc,DJD  . Bronchitis   . Constipation 07/29/2014  . IUD (intrauterine device) in place 03/18/2013   Seen on [place]  . Migraines   . Pelvic pain in female 07/29/2014  . Right ovarian cyst 07/29/2014    Family History: Family History  Problem Relation Age of Onset  . Diabetes Mother   . Hypertension Mother   . Breast cancer Mother   . Cancer Father        brain and liver pancreas  . Hypertension Father   . Cancer Maternal Aunt   . Cancer Paternal Aunt        breast  . Breast cancer Paternal Aunt        in her 66s  . Cancer Maternal Grandmother        ovarian   . Stroke Maternal Grandfather   . Diabetes Paternal Grandfather   . Heart attack Paternal Grandfather  Social History: Social History   Socioeconomic History  . Marital status: Married    Spouse name: Not on file  . Number of children: 1  . Years of education: Not on file  . Highest education level: Not on file  Occupational History  . Not on file  Tobacco Use  . Smoking status: Former Smoker    Packs/day: 0.00    Years: 21.00    Pack years: 0.00    Types: Cigarettes  . Smokeless tobacco: Never Used  Vaping Use  . Vaping Use: Never used  Substance and Sexual Activity  . Alcohol use: Not Currently    Comment: occ. glass of wine  . Drug use: No  . Sexual activity: Yes    Birth control/protection: I.U.D.  Other Topics  Concern  . Not on file  Social History Narrative  . Not on file   Social Determinants of Health   Financial Resource Strain:   . Difficulty of Paying Living Expenses: Not on file  Food Insecurity:   . Worried About Charity fundraiser in the Last Year: Not on file  . Ran Out of Food in the Last Year: Not on file  Transportation Needs:   . Lack of Transportation (Medical): Not on file  . Lack of Transportation (Non-Medical): Not on file  Physical Activity:   . Days of Exercise per Week: Not on file  . Minutes of Exercise per Session: Not on file  Stress:   . Feeling of Stress : Not on file  Social Connections:   . Frequency of Communication with Friends and Family: Not on file  . Frequency of Social Gatherings with Friends and Family: Not on file  . Attends Religious Services: Not on file  . Active Member of Clubs or Organizations: Not on file  . Attends Archivist Meetings: Not on file  . Marital Status: Not on file  Intimate Partner Violence:   . Fear of Current or Ex-Partner: Not on file  . Emotionally Abused: Not on file  . Physically Abused: Not on file  . Sexually Abused: Not on file    Vital Signs: Blood pressure (!) 142/90, pulse 87, temperature 98.6 F (37 C), resp. rate 16, height 5\' 5"  (1.651 m), weight 176 lb 12.8 oz (80.2 kg), SpO2 99 %.  Examination: General Appearance: The patient is well-developed, well-nourished, and in no distress. Skin: Gross inspection of skin unremarkable. Head: normocephalic, no gross deformities. Eyes: no gross deformities noted. ENT: ears appear grossly normal no exudates. Neck: Supple. No thyromegaly. No LAD. Respiratory: no rhonchi noted. Cardiovascular: Normal S1 and S2 without murmur or rub. Extremities: No cyanosis. pulses are equal. Neurologic: Alert and oriented. No involuntary movements.  LABS: No results found for this or any previous visit (from the past 2160 hour(s)).  Radiology: MM 3D SCREEN BREAST  BILATERAL  Result Date: 12/03/2019 CLINICAL DATA:  Screening. EXAM: DIGITAL SCREENING BILATERAL MAMMOGRAM WITH TOMO AND CAD COMPARISON:  Previous exam(s). ACR Breast Density Category c: The breast tissue is heterogeneously dense, which may obscure small masses. FINDINGS: There are no findings suspicious for malignancy. Images were processed with CAD. IMPRESSION: No mammographic evidence of malignancy. A result letter of this screening mammogram will be mailed directly to the patient. RECOMMENDATION: Screening mammogram in one year. (Code:SM-B-01Y) BI-RADS CATEGORY  1: Negative. Electronically Signed   By: Lajean Manes M.D.   On: 12/03/2019 09:43    No results found.  CT Chest High Resolution  Result Date: 11/28/2019  CLINICAL DATA:  Interstitial lung disease, worsening shortness of breath on exertion and fatigue, COVID 10/13/2019, former smoker EXAM: CT CHEST WITHOUT CONTRAST TECHNIQUE: Multidetector CT imaging of the chest was performed following the standard protocol without intravenous contrast. High resolution imaging of the lungs, as well as inspiratory and expiratory imaging, was performed. COMPARISON:  None. FINDINGS: Cardiovascular: No significant vascular findings. Normal heart size. Scattered coronary artery calcifications. No pericardial effusion. Mediastinum/Nodes: No enlarged mediastinal, hilar, or axillary lymph nodes. Thyroid gland, trachea, and esophagus demonstrate no significant findings. Lungs/Pleura: There is scattered peripheral irregular and ground-glass airspace opacity throughout the lungs, most conspicuous at the lung bases. There is some evidence of subpleural sparing and development of bandlike scarring. Lobular air trapping on expiratory phase imaging. No pleural effusion or pneumothorax. Upper Abdomen: No acute abnormality.  Status post cholecystectomy. Musculoskeletal: No chest wall mass or suspicious bone lesions identified. IMPRESSION: 1. There is scattered peripheral  irregular and ground-glass airspace opacity throughout the lungs, most conspicuous at the lung bases. There is some evidence of subpleural sparing and development of bandlike scarring. Lobular air trapping on expiratory phase imaging. Constellation of findings is generally in keeping with subacute COVID-19 airspace disease, likely with some degree of developing post post infectious scarring. 2. Lobular air trapping on expiratory phase imaging, suggestive of small airways disease. 3. Coronary artery disease, advanced for patient age. Electronically Signed   By: Eddie Candle M.D.   On: 11/28/2019 13:27   MM 3D SCREEN BREAST BILATERAL  Result Date: 12/03/2019 CLINICAL DATA:  Screening. EXAM: DIGITAL SCREENING BILATERAL MAMMOGRAM WITH TOMO AND CAD COMPARISON:  Previous exam(s). ACR Breast Density Category c: The breast tissue is heterogeneously dense, which may obscure small masses. FINDINGS: There are no findings suspicious for malignancy. Images were processed with CAD. IMPRESSION: No mammographic evidence of malignancy. A result letter of this screening mammogram will be mailed directly to the patient. RECOMMENDATION: Screening mammogram in one year. (Code:SM-B-01Y) BI-RADS CATEGORY  1: Negative. Electronically Signed   By: Lajean Manes M.D.   On: 12/03/2019 09:43      Assessment and Plan: Patient Active Problem List   Diagnosis Date Noted  . Screening for colorectal cancer 09/23/2018  . Encounter for gynecological examination with Papanicolaou smear of cervix 09/23/2018  . Chest pain 01/31/2018  . Palpitations 01/31/2018  . Paronychia of right ring finger I&D 01/09/17   . Pelvic pain in female 07/29/2014  . Right ovarian cyst 07/29/2014  . Constipation 07/29/2014  . IUD (intrauterine device) in place 03/18/2013    1. SOB normal PFT basically. She states still however she is SOB. Findigs of CAD on CT would get a stress myoview 2. GGO on CT scan result of covid 19 will need follow up  scan 3. Hypersomnia with abnormal CT scan. Would get a sleep study in lab if possible due to underlying pulmonary issues and possible cardiac issues with shortness of breath 4. Obesity work on diet and exercise weight loss per PCP 5. ?Post menopausal will see PCP regarding this  General Counseling: I have discussed the findings of the evaluation and examination with Shamekia.  I have also discussed any further diagnostic evaluation thatmay be needed or ordered today. Zaryia verbalizes understanding of the findings of todays visit. We also reviewed her medications today and discussed drug interactions and side effects including but not limited excessive drowsiness and altered mental states. We also discussed that there is always a risk not just to her but also people around her.  she has been encouraged to call the office with any questions or concerns that should arise related to todays visit.  Orders Placed This Encounter  Procedures  . CT Chest High Resolution    Standing Status:   Future    Standing Expiration Date:   02/21/2020    Order Specific Question:   Is patient pregnant?    Answer:   No    Order Specific Question:   Preferred imaging location?    Answer:   [facility]  . Flu Vaccine [facility]  . Myocardial Perfusion Imaging    Standing Status:   Future    Standing Expiration Date:   06/21/2020    Order Specific Question:   Patient weight in lbs    Answer:   176    Order Specific Question:   Where should this be performed?    Answer:   [facility] Specific Question:   Type of stress    Answer:   Lexiscan  . Home sleep test    Order Specific Question:   Where should this test be performed:    Answer:   [facility]     Time spent: 29  I have personally obtained a history, examined the patient, evaluated laboratory and imaging results, formulated the assessment and plan and placed orders.    Allyne Gee, MD [facility] Pulmonary  and Critical Care Sleep medicine

## 2019-12-24 ENCOUNTER — Other Ambulatory Visit: Payer: Self-pay | Admitting: Internal Medicine

## 2019-12-24 DIAGNOSIS — R0602 Shortness of breath: Secondary | ICD-10-CM

## 2019-12-31 ENCOUNTER — Other Ambulatory Visit: Payer: Self-pay

## 2019-12-31 ENCOUNTER — Ambulatory Visit: Payer: 59 | Admitting: Internal Medicine

## 2019-12-31 DIAGNOSIS — G4719 Other hypersomnia: Secondary | ICD-10-CM

## 2020-01-07 ENCOUNTER — Other Ambulatory Visit: Payer: Self-pay

## 2020-01-07 ENCOUNTER — Encounter
Admission: RE | Admit: 2020-01-07 | Discharge: 2020-01-07 | Disposition: A | Discharge status: Home / Self Care | Payer: 59 | Source: Ambulatory Visit | Attending: Internal Medicine | Admitting: Internal Medicine

## 2020-01-07 DIAGNOSIS — R0602 Shortness of breath: Secondary | ICD-10-CM | POA: Insufficient documentation

## 2020-01-12 ENCOUNTER — Other Ambulatory Visit: Payer: Self-pay

## 2020-01-12 ENCOUNTER — Encounter
Admission: RE | Admit: 2020-01-12 | Discharge: 2020-01-12 | Disposition: A | Discharge status: Home / Self Care | Payer: 59 | Source: Ambulatory Visit | Attending: Internal Medicine | Admitting: Internal Medicine

## 2020-01-12 DIAGNOSIS — R0602 Shortness of breath: Secondary | ICD-10-CM | POA: Diagnosis not present

## 2020-01-12 LAB — NM MYOCAR MULTI W/SPECT W/WALL MOTION / EF
Estimated workload: 1 METS
Exercise duration (min): 0 min
Exercise duration (sec): 0 s
LV dias vol: 72 mL (ref 46–106)
LV sys vol: 22 mL
MPHR: 175 {beats}/min
Peak HR: 120 {beats}/min
Percent HR: 68 %
Rest HR: 82 {beats}/min
SDS: 0
SRS: 0
SSS: 0
TID: 1

## 2020-01-12 MED ORDER — REGADENOSON 0.4 MG/5ML IV SOLN
0.4000 mg | Freq: Once | INTRAVENOUS | Status: AC
  Administered 2020-01-12: 0.4 mg via INTRAVENOUS

## 2020-01-12 MED ORDER — TECHNETIUM TC 99M TETROFOSMIN IV KIT
10.0000 | PACK | Freq: Once | INTRAVENOUS | Status: AC | PRN
  Administered 2020-01-12: 9.891 via INTRAVENOUS

## 2020-01-12 MED ORDER — TECHNETIUM TC 99M TETROFOSMIN IV KIT
30.0000 | PACK | Freq: Once | INTRAVENOUS | Status: AC | PRN
  Administered 2020-01-12: 29.969 via INTRAVENOUS

## 2020-01-14 ENCOUNTER — Other Ambulatory Visit: Payer: 59

## 2020-01-16 NOTE — Procedures (Signed)
[facility] [place], [place] [address]  Sleep Specialist: Allyne Gee, MD [place] Sleep Study Interpretation  Patient Name: Gloria Acosta Patient MR [id number] DOB:1975/08/31  Date of Study: 12/31/2019  Indications for study: Hypersomnia  BMI: 29.2 kg/m       Respiratory Data:  Total AHI: 3.5/h  Total Obstructive Apneas: 2  Total Central Apneas: 1  Total Mixed Apneas: 0  Total Hypopneas: 17  If the AHI is greater than 5 per hour patient qualifies for PAP evaluation  Oximetry Data:  Oxygen Desaturation Index: 6.2/h  Lowest Desaturation: 79%  Cardiac Data:  Minimum Heart Rate: 54  Maximum Heart Rate: 114   Impression / Diagnosis:  This apnea study did not demonstrate significant obstructive sleep apnea.  The overall AHI was within normal limits.  Patient did have desaturation to 79% clinical correlation recommended.  GENERAL Recommendations:  1.  Consider Auto PAP with pressure ranges 5-20 cmH20 with download, or facility based PAP Titration Study  2.  Consider PAP interface mask fitted for patient comfort, Heated Humidification & PAP compliance monitoring (1 month, 3 months & 12 months after PAP initiation)  3. Consider treatment with mandibular advancement splint (MAS) or referral to an ENT surgeon for modification to the upper airway if the patient prefers an alternate therapy or the PAP trial is unsuccessful  4. Sleep hygiene measures should be discussed with the patient  5. Behavioral therapy such as weight reduction or smoking cessation as appropriate for the patient  6. Advise patient against the use of alcohol or sedatives in so much as these substances can worsen excessive daytime sleepiness and respiratory disturbances of sleep  7. Advise patient against participating in potentially dangerous activities while drowsy such as operating a motor vehicle, heavy equipment or power tools as it can put them and  others in danger  8. Advise patient of the long term consequences of OSA if left untreated, need for treatment and close follow up  9. Clinical follow up as deemed necessary     This Level III home sleep study was performed using the [employer], a 4 channel screening device subject to limitations. Depending on actual total sleep time, not measured in this study, the AHI (sum of apneas and hypopneas/hr of sleep) and therefore the severity of sleep apnea may be underestimated. As with any single night study, including Level 1 attended PSG, severity of sleep apnea may also be underestimated due to the lack of supine and/or REM sleep.  The interpretation associated with this report is based on normal values and degrees of severity in accordance with AASM parameters and/or estimated from multiple sources in the literature for adults ages 33-80+. These may not agree with the displayed values. The patient's treating physician should use the interpretation and recommendations in conjunction with the overall clinical evaluation and treatment of the patient.  Some of the terminology used in this scored [facility] report was developed several years ago and may not always be in accordance with current nomenclature. This in no way affects the accuracy of the data or the reliability of the interpretation and recommendations.

## 2020-01-20 ENCOUNTER — Encounter: Payer: Self-pay | Admitting: Internal Medicine

## 2020-01-20 ENCOUNTER — Ambulatory Visit (INDEPENDENT_AMBULATORY_CARE_PROVIDER_SITE_OTHER): Payer: 59 | Admitting: Internal Medicine

## 2020-01-20 ENCOUNTER — Other Ambulatory Visit: Payer: Self-pay

## 2020-01-20 DIAGNOSIS — G4719 Other hypersomnia: Secondary | ICD-10-CM

## 2020-01-20 DIAGNOSIS — J849 Interstitial pulmonary disease, unspecified: Secondary | ICD-10-CM

## 2020-01-20 DIAGNOSIS — R0602 Shortness of breath: Secondary | ICD-10-CM | POA: Diagnosis not present

## 2020-01-20 NOTE — Progress Notes (Signed)
[facility], [place] [address]  Pulmonary Sleep Medicine   Office Visit Note  Patient Name: Gloria Acosta DOB: 1976/01/18 MRN [id number]  Date of Service: 01/26/2020  Complaints/HPI: Patient is here for routine pulmonary follow-up Post COVID infection in August-continued symptoms of SHOB, PFT normal findings Underwent home sleep study testing--AHI 4, SpO2 average 94%, lowest saturation 79%, spent 1 minute with saturation less than 89% Does not qualify for CPAP therapy--mild desaturation events Normal stress test Reports shortness of breath has slightly improved since last visit but does continue to have [facility] especially with exertion, also experiencing fatigue CT scan found CAD advanced with patient age--encouraged to follow-up with PCP for further vascular studies regarding this finding  ROS  General: (-) fever, (-) chills, (-) night sweats, (-) weakness Skin: (-) rashes, (-) itching,. Eyes: (-) visual changes, (-) redness, (-) itching. Nose and Sinuses: (-) nasal stuffiness or itchiness, (-) postnasal drip, (-) nosebleeds, (-) sinus trouble. Mouth and Throat: (-) sore throat, (-) hoarseness. Neck: (-) swollen glands, (-) enlarged thyroid, (-) neck pain. Respiratory: - cough, (-) bloody sputum, + shortness of breath, - wheezing. Cardiovascular: - ankle swelling, (-) chest pain. Lymphatic: (-) lymph node enlargement. Neurologic: (-) numbness, (-) tingling. Psychiatric: (-) anxiety, (-) depression   Current Medication: Outpatient Encounter Medications as of 01/20/2020  Medication Sig  . amphetamine-dextroamphetamine (ADDERALL) 10 MG tablet Take 10 mg by mouth 2 (two) times daily with a meal.  . HYDROcodone-acetaminophen (NORCO) 10-325 MG per tablet Take 1 tablet by mouth as needed.   . Levonorgestrel (KYLEENA) 19.5 MG IUD by Intrauterine route.  . Multiple Vitamin (MULTIVITAMIN) tablet Take 1 tablet by mouth daily.  . predniSONE (DELTASONE)  20 MG tablet Take 20 mg by mouth 2 (two) times daily with a meal.   No facility-administered encounter medications on file as of 01/20/2020.    Surgical History: Past Surgical History:  Procedure Laterality Date  . BREAST BIOPSY Right   . CESAREAN SECTION    . CHOLECYSTECTOMY    . ECTOPIC PREGNANCY SURGERY    . IRRIGATION AND DEBRIDEMENT ABSCESS Right 01/09/2017   Procedure: MINOR INCISION AND DRAINAGE OF ABSCESS RIGHT RING FINGER;  Surgeon: Carole Civil, MD;  Location: [facility];  Service: Orthopedics;  Laterality: Right;  . OVARIAN CYST REMOVAL    . TUBAL LIGATION      Medical History: Past Medical History:  Diagnosis Date  . Addison's disease ([place])   . Arthritis    in back  . Back pain    2 bulging disc,DJD  . Bronchitis   . Constipation 07/29/2014  . IUD (intrauterine device) in place 03/18/2013   Seen on [place]  . Migraines   . Pelvic pain in female 07/29/2014  . Right ovarian cyst 07/29/2014    Family History: Family History  Problem Relation Age of Onset  . Diabetes Mother   . Hypertension Mother   . Breast cancer Mother   . Cancer Father        brain and liver pancreas  . Hypertension Father   . Cancer Maternal Aunt   . Cancer Paternal Aunt        breast  . Breast cancer Paternal Aunt        in her 19s  . Cancer Maternal Grandmother        ovarian   . Stroke Maternal Grandfather   . Diabetes Paternal Grandfather   . Heart attack Paternal Grandfather     Social  History: Social History   Socioeconomic History  . Marital status: Married    Spouse name: Not on file  . Number of children: 1  . Years of education: Not on file  . Highest education level: Not on file  Occupational History  . Not on file  Tobacco Use  . Smoking status: Former Smoker    Packs/day: 0.00    Years: 21.00    Pack years: 0.00    Types: Cigarettes  . Smokeless tobacco: Never Used  Vaping Use  . Vaping Use: Never used  Substance and Sexual Activity  . Alcohol use: Not  Currently    Comment: occ. glass of wine  . Drug use: No  . Sexual activity: Yes    Birth control/protection: I.U.D.  Other Topics Concern  . Not on file  Social History Narrative  . Not on file   Social Determinants of Health   Financial Resource Strain:   . Difficulty of Paying Living Expenses: Not on file  Food Insecurity:   . Worried About Charity fundraiser in the Last Year: Not on file  . Ran Out of Food in the Last Year: Not on file  Transportation Needs:   . Lack of Transportation (Medical): Not on file  . Lack of Transportation (Non-Medical): Not on file  Physical Activity:   . Days of Exercise per Week: Not on file  . Minutes of Exercise per Session: Not on file  Stress:   . Feeling of Stress : Not on file  Social Connections:   . Frequency of Communication with Friends and Family: Not on file  . Frequency of Social Gatherings with Friends and Family: Not on file  . Attends Religious Services: Not on file  . Active Member of Clubs or Organizations: Not on file  . Attends Archivist Meetings: Not on file  . Marital Status: Not on file  Intimate Partner Violence:   . Fear of Current or Ex-Partner: Not on file  . Emotionally Abused: Not on file  . Physically Abused: Not on file  . Sexually Abused: Not on file    Vital Signs: Blood pressure 124/80, pulse 86, temperature 97.9 F (36.6 C), height 5\' 4"  (1.626 m), weight 180 lb 6.4 oz (81.8 kg), SpO2 98 %.  Examination: General Appearance: The patient is well-developed, well-nourished, and in no distress. Skin: Gross inspection of skin unremarkable. Head: normocephalic, no gross deformities. Eyes: no gross deformities noted. ENT: ears appear grossly normal no exudates. Neck: Supple. No thyromegaly. No LAD. Respiratory: Clear throughout, . Cardiovascular: Normal S1 and S2 without murmur or rub. Extremities: No cyanosis. pulses are equal. Neurologic: Alert and oriented. No involuntary  movements.  LABS: Recent Results (from the past 2160 hour(s))  Pulmonary function test     Status: None   Collection Time: 12/10/19  2:30 PM  Result Value Ref Range   FEV1     FVC     FEV1/FVC     TLC     DLCO    NM Myocar Multi W/Spect W/Wall Motion / EF     Status: None   Collection Time: 01/12/20 12:50 PM  Result Value Ref Range   Rest HR 82 bpm   Rest BP 143/81 mmHg   Exercise duration (sec) 0 sec   Percent HR 68 %   Exercise duration (min) 0 min   Estimated workload 1.0 METS   Peak HR 120 bpm   Peak BP 143/81 mmHg   MPHR 175 bpm  SSS 0    SRS 0    SDS 0    TID 1.00    LV sys vol 22 mL   LV dias vol 72 46 - 106 mL    Radiology: NM Myocar Multi W/Spect W/Wall Motion / EF  Result Date: 01/12/2020  There was no ST segment deviation noted during stress.  No T wave inversion was noted during stress.  The study is normal.  This is a low risk study.     No results found.  NM Myocar Multi W/Spect W/Wall Motion / EF  Result Date: 01/12/2020  There was no ST segment deviation noted during stress.  No T wave inversion was noted during stress.  The study is normal.  This is a low risk study.       Assessment and Plan: Patient Active Problem List   Diagnosis Date Noted  . Screening for colorectal cancer 09/23/2018  . Encounter for gynecological examination with Papanicolaou smear of cervix 09/23/2018  . Chest pain 01/31/2018  . Palpitations 01/31/2018  . Paronychia of right ring finger I&D 01/09/17   . Pelvic pain in female 07/29/2014  . Right ovarian cyst 07/29/2014  . Constipation 07/29/2014  . IUD (intrauterine device) in place 03/18/2013    1. ILD (interstitial lung disease) ([place]) S/p COVID in August, CT chest 10/01: There is scattered peripheral irregular and ground-glass airspace opacity throughout the lungs, most conspicuous at the lung bases. There is some evidence of subpleural sparing and development of bandlike scarring. Lobular air  trapping on expiratory phase imaging. Constellation of findings is generally in keeping with subacute COVID-19 airspace disease, likely with some degree of developing Will need repeat imaging - CT Chest High Resolution; Future  2. Other hypersomnia AHI 4 on home sleep study--if hypersomnia persists may need to consider in lab testing for OSA for more accurate results  3. Shortness of breath Symptoms have somewhat improved--will continue monitoring  General Counseling: I have discussed the findings of the evaluation and examination with Gloria Acosta.  I have also discussed any further diagnostic evaluation thatmay be needed or ordered today. Gloria Acosta verbalizes understanding of the findings of todays visit. We also reviewed her medications today and discussed drug interactions and side effects including but not limited excessive drowsiness and altered mental states. We also discussed that there is always a risk not just to her but also people around her. she has been encouraged to call the office with any questions or concerns that should arise related to todays visit.  Orders Placed This Encounter  Procedures  . CT Chest High Resolution    Standing Status:   Future    Standing Expiration Date:   01/19/2021    Scheduling Instructions:     Please schedule close to end of year in December    Order Specific Question:   Is patient pregnant?    Answer:   No    Order Specific Question:   Preferred imaging location?    Answer:   [facility]     Time spent: 36  I have personally obtained a history, examined the patient, evaluated laboratory and imaging results, formulated the assessment and plan and placed orders. This patient was seen by Casey Burkitt AGNP-C in Collaboration with Dr. Devona Konig as a part of collaborative care agreement.    Allyne Gee, MD [facility] Pulmonary and Critical Care Sleep medicine

## 2020-01-26 ENCOUNTER — Encounter: Payer: Self-pay | Admitting: Internal Medicine

## 2020-01-26 NOTE — Patient Instructions (Signed)
Sleep Apnea Sleep apnea is a condition in which breathing pauses or becomes shallow during sleep. Episodes of sleep apnea usually last 10 seconds or longer, and they may occur as many as 20 times an hour. Sleep apnea disrupts your sleep and keeps your body from getting the rest that it needs. This condition can increase your risk of certain health problems, including:  Heart attack.  Stroke.  Obesity.  Diabetes.  Heart failure.  Irregular heartbeat. What are the causes? There are three kinds of sleep apnea:  Obstructive sleep apnea. This kind is caused by a blocked or collapsed airway.  Central sleep apnea. This kind happens when the part of the brain that controls breathing does not send the correct signals to the muscles that control breathing.  Mixed sleep apnea. This is a combination of obstructive and central sleep apnea. The most common cause of this condition is a collapsed or blocked airway. An airway can collapse or become blocked if:  Your throat muscles are abnormally relaxed.  Your tongue and tonsils are larger than normal.  You are overweight.  Your airway is smaller than normal. What increases the risk? You are more likely to develop this condition if you:  Are overweight.  Smoke.  Have a smaller than normal airway.  Are elderly.  Are female.  Drink alcohol.  Take sedatives or tranquilizers.  Have a family history of sleep apnea. What are the signs or symptoms? Symptoms of this condition include:  Trouble staying asleep.  Daytime sleepiness and tiredness.  Irritability.  Loud snoring.  Morning headaches.  Trouble concentrating.  Forgetfulness.  Decreased interest in sex.  Unexplained sleepiness.  Mood swings.  Personality changes.  Feelings of depression.  Waking up often during the night to urinate.  Dry mouth.  Sore throat. How is this diagnosed? This condition may be diagnosed with:  A medical history.  A physical  exam.  A series of tests that are done while you are sleeping (sleep study). These tests are usually done in a sleep lab, but they may also be done at home. How is this treated? Treatment for this condition aims to restore normal breathing and to ease symptoms during sleep. It may involve managing health issues that can affect breathing, such as high blood pressure or obesity. Treatment may include:  Sleeping on your side.  Using a decongestant if you have nasal congestion.  Avoiding the use of depressants, including alcohol, sedatives, and narcotics.  Losing weight if you are overweight.  Making changes to your diet.  Quitting smoking.  Using a device to open your airway while you sleep, such as: ? An oral appliance. This is a custom-made mouthpiece that shifts your lower jaw forward. ? A continuous positive airway pressure (CPAP) device. This device blows air through a mask when you breathe out (exhale). ? A nasal expiratory positive airway pressure (EPAP) device. This device has valves that you put into each nostril. ? A bi-level positive airway pressure (BPAP) device. This device blows air through a mask when you breathe in (inhale) and breathe out (exhale).  Having surgery if other treatments do not work. During surgery, excess tissue is removed to create a wider airway. It is important to get treatment for sleep apnea. Without treatment, this condition can lead to:  High blood pressure.  Coronary artery disease.  In men, an inability to achieve or maintain an erection (impotence).  Reduced thinking abilities. Follow these instructions at home: Lifestyle  Make any lifestyle changes   that your health care provider recommends.  Eat a healthy, well-balanced diet.  Take steps to lose weight if you are overweight.  Avoid using depressants, including alcohol, sedatives, and narcotics.  Do not use any products that contain nicotine or tobacco, such as cigarettes,  e-cigarettes, and chewing tobacco. If you need help quitting, ask your health care provider. General instructions  Take over-the-counter and prescription medicines only as told by your health care provider.  If you were given a device to open your airway while you sleep, use it only as told by your health care provider.  If you are having surgery, make sure to tell your health care provider you have sleep apnea. You may need to bring your device with you.  Keep all follow-up visits as told by your health care provider. This is important. Contact a health care provider if:  The device that you received to open your airway during sleep is uncomfortable or does not seem to be working.  Your symptoms do not improve.  Your symptoms get worse. Get help right away if:  You develop: ? Chest pain. ? Shortness of breath. ? Discomfort in your back, arms, or stomach.  You have: ? Trouble speaking. ? Weakness on one side of your body. ? Drooping in your face. These symptoms may represent a serious problem that is an emergency. Do not wait to see if the symptoms will go away. Get medical help right away. Call your local emergency services (911 in the [place] Do not drive yourself to the hospital. Summary  Sleep apnea is a condition in which breathing pauses or becomes shallow during sleep.  The most common cause is a collapsed or blocked airway.  The goal of treatment is to restore normal breathing and to ease symptoms during sleep. This information is not intended to replace advice given to you by your health care provider. Make sure you discuss any questions you have with your health care provider. Document Revised: 07/31/2018 Document Reviewed: 10/09/2017 Elsevier Patient Education  2020 [employer] Inc.  

## 2020-03-02 ENCOUNTER — Ambulatory Visit: Payer: 59 | Admitting: Internal Medicine

## 2020-04-20 ENCOUNTER — Ambulatory Visit: Payer: 59 | Admitting: Internal Medicine

## 2020-10-04 ENCOUNTER — Other Ambulatory Visit (HOSPITAL_COMMUNITY): Payer: Self-pay | Admitting: Internal Medicine

## 2020-10-04 ENCOUNTER — Other Ambulatory Visit: Payer: Self-pay | Admitting: Internal Medicine

## 2020-10-04 DIAGNOSIS — M545 Low back pain, unspecified: Secondary | ICD-10-CM

## 2020-10-04 DIAGNOSIS — M5416 Radiculopathy, lumbar region: Secondary | ICD-10-CM

## 2020-10-04 DIAGNOSIS — M5020 Other cervical disc displacement, unspecified cervical region: Secondary | ICD-10-CM

## 2020-10-07 ENCOUNTER — Ambulatory Visit (HOSPITAL_COMMUNITY)
Admission: RE | Admit: 2020-10-07 | Discharge: 2020-10-07 | Disposition: A | Discharge status: Home / Self Care | Payer: 59 | Source: Ambulatory Visit | Attending: Internal Medicine | Admitting: Internal Medicine

## 2020-10-07 ENCOUNTER — Other Ambulatory Visit: Payer: Self-pay

## 2020-10-07 DIAGNOSIS — M5416 Radiculopathy, lumbar region: Secondary | ICD-10-CM | POA: Insufficient documentation

## 2020-10-07 DIAGNOSIS — M545 Low back pain, unspecified: Secondary | ICD-10-CM | POA: Insufficient documentation

## 2020-10-07 DIAGNOSIS — M5020 Other cervical disc displacement, unspecified cervical region: Secondary | ICD-10-CM | POA: Insufficient documentation

## 2020-10-13 ENCOUNTER — Ambulatory Visit (HOSPITAL_COMMUNITY): Payer: 59

## 2020-10-13 ENCOUNTER — Other Ambulatory Visit (HOSPITAL_COMMUNITY): Payer: 59

## 2020-10-25 IMAGING — MG DIGITAL SCREENING BILAT W/ TOMO W/ CAD
8 series · 8 of 24 positions shown · non-contrast
Comparison: Previous exam(s).

CLINICAL DATA: Screening.

EXAM:
DIGITAL SCREENING BILATERAL MAMMOGRAM WITH TOMO AND CAD

[L MLO synth-2D]
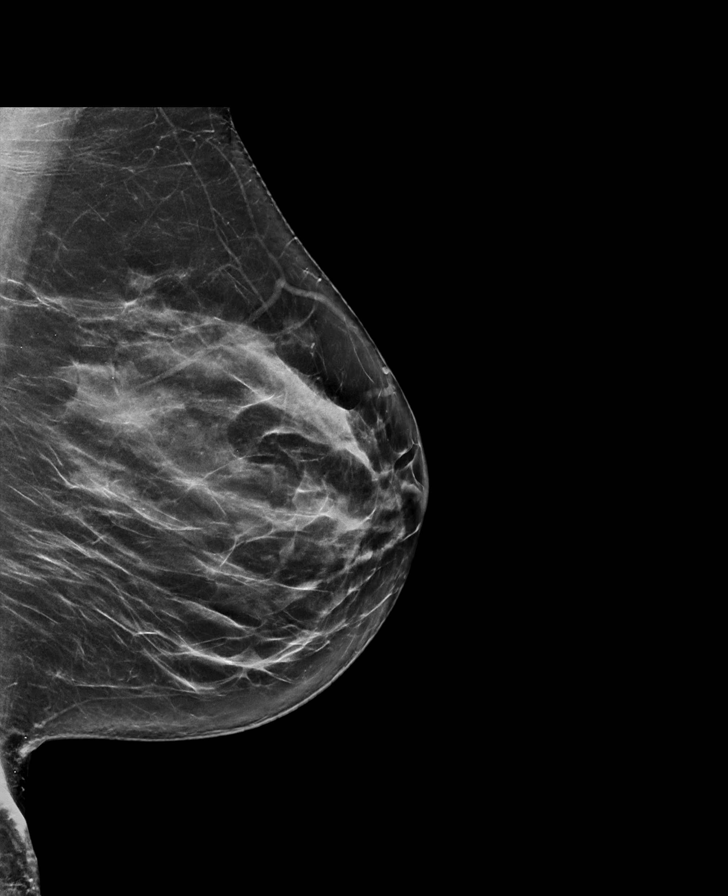

[R MLO synth-2D]
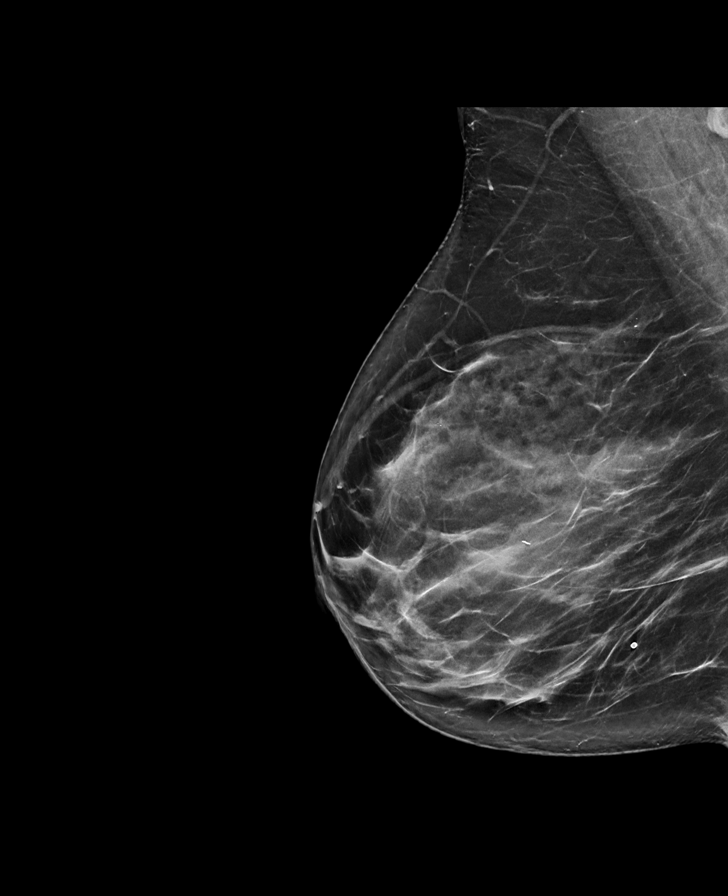

[R CC synth-2D]
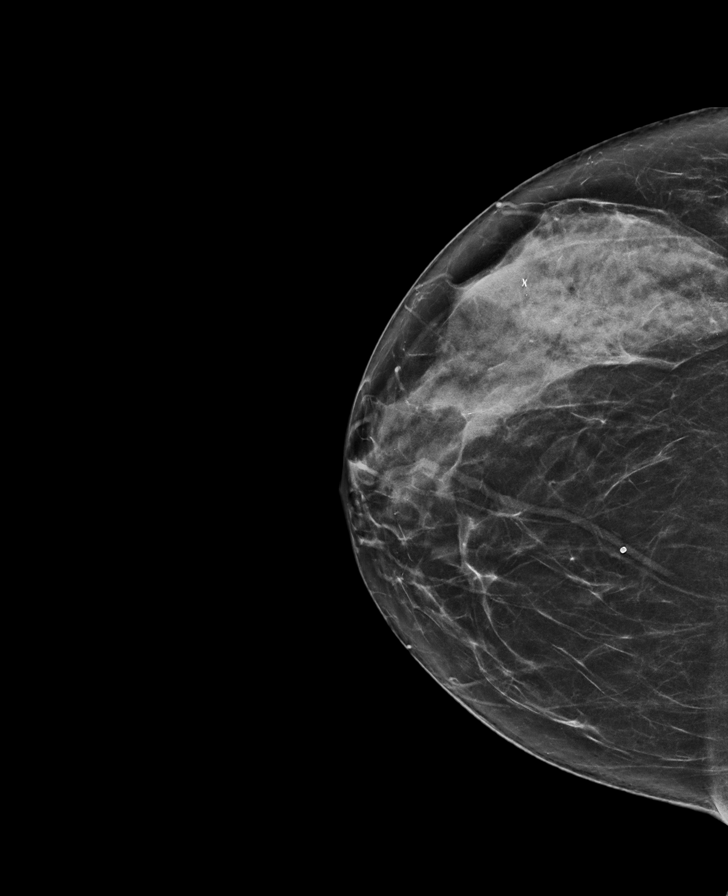

[L CC synth-2D]
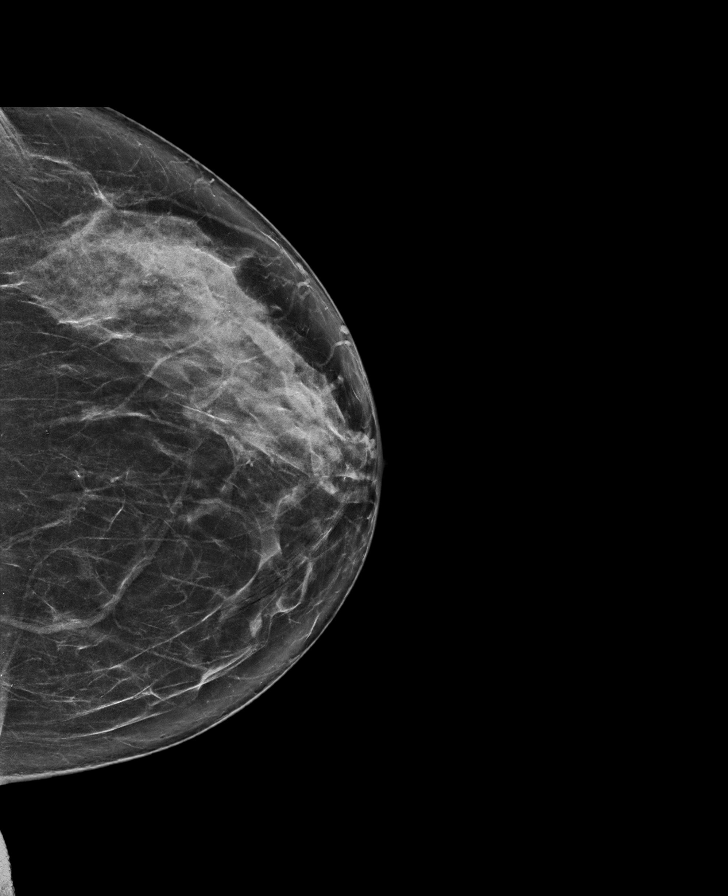

[R MLO tomo · tomo slice 40/79.0]
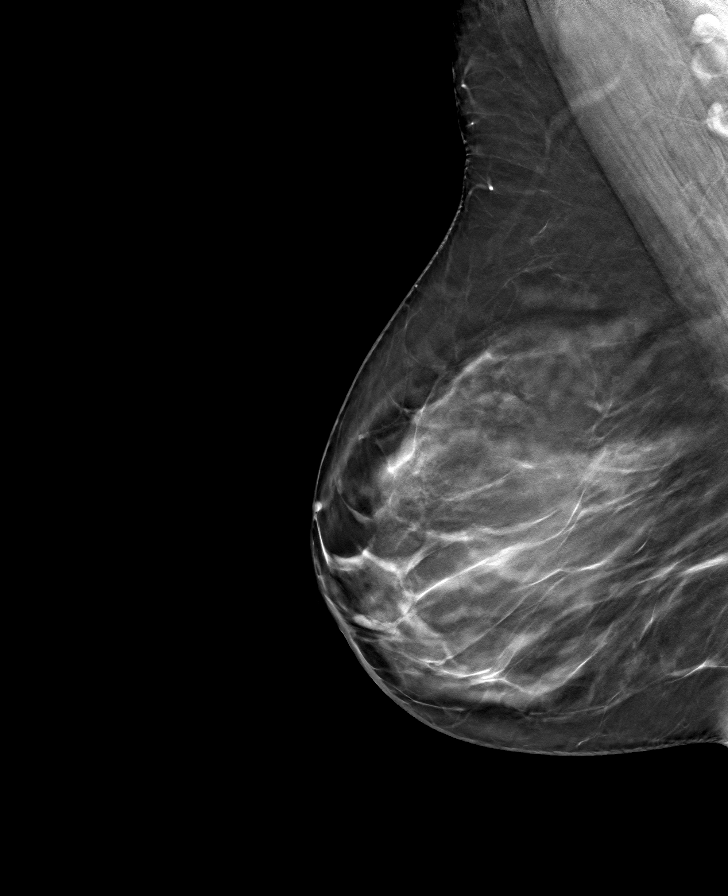

[L MLO tomo · tomo slice 37/73.0]
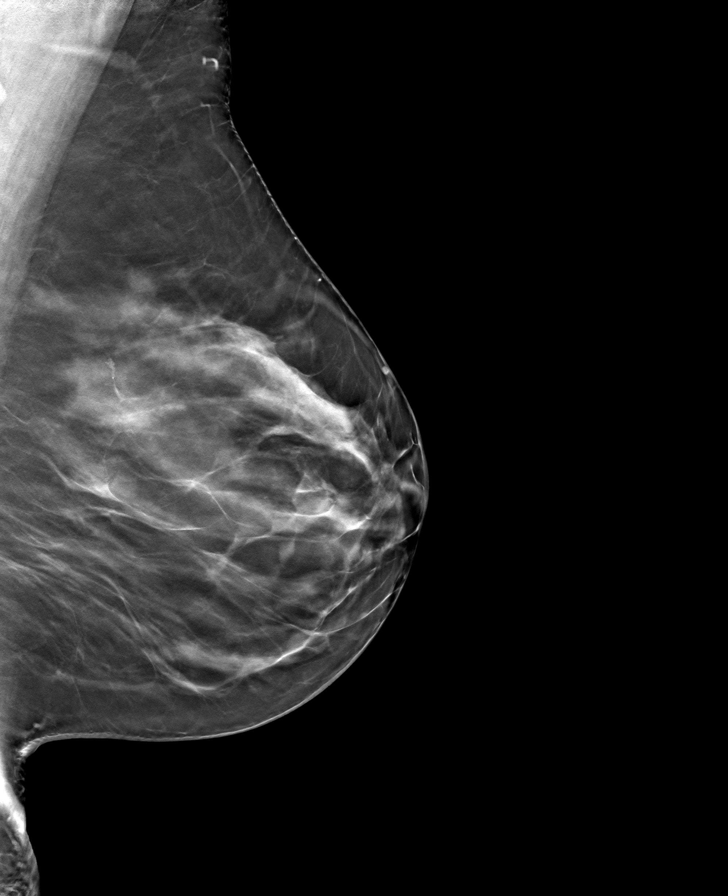

[R CC tomo · tomo slice 34/67.0]
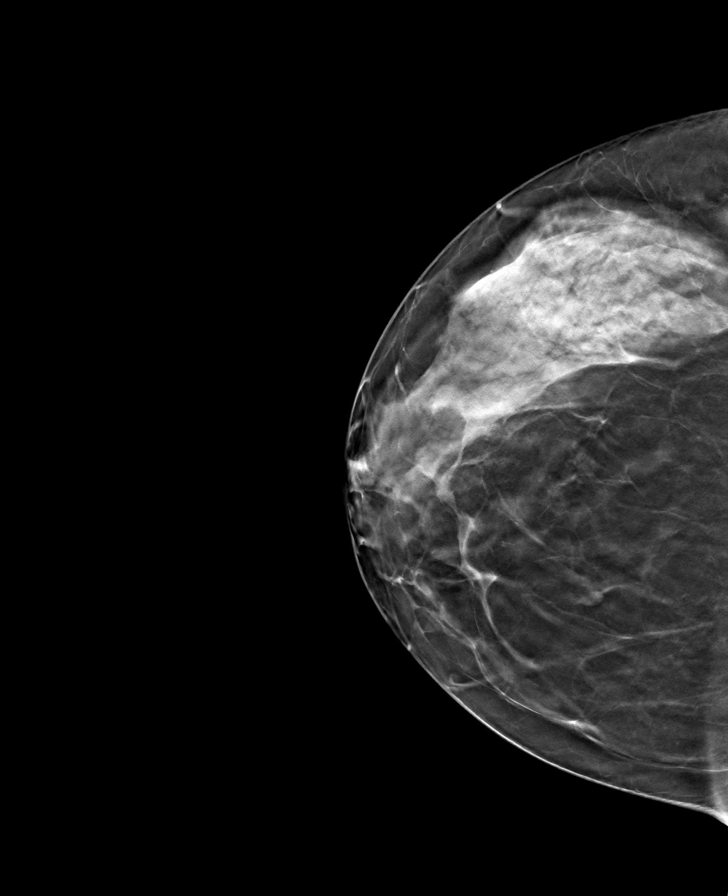

[L CC tomo · tomo slice 38/75.0]
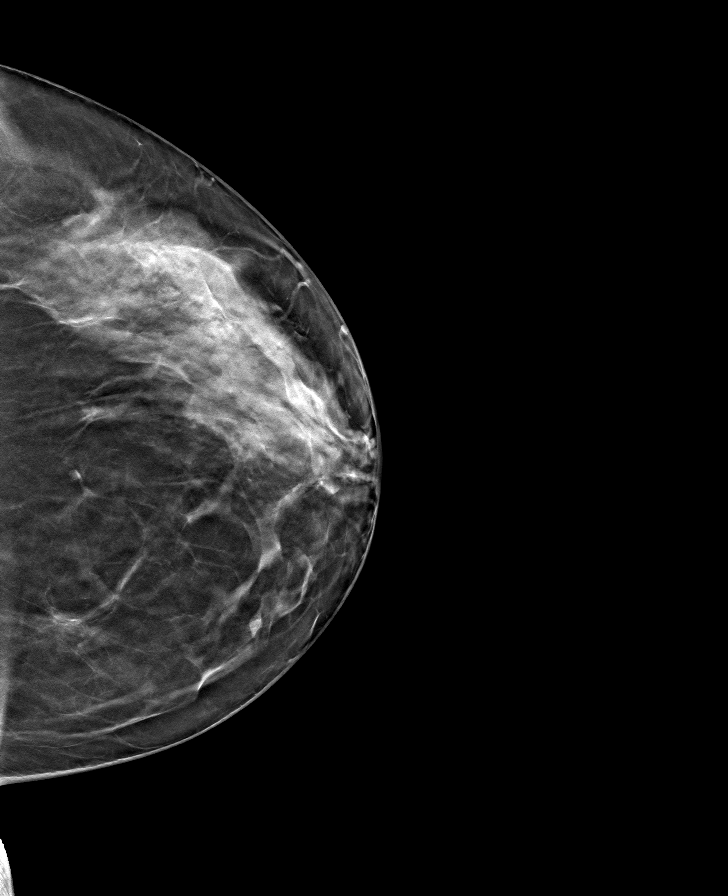

[8 of 24 positions shown; findings below may reference images not displayed]

ACR Breast Density Category c: The breast tissue is heterogeneously
dense, which may obscure small masses.
FINDINGS: There are no findings suspicious for malignancy. Images were
processed with CAD.
IMPRESSION: No mammographic evidence of malignancy. A result letter of this
screening mammogram will be mailed directly to the patient.

RECOMMENDATION:
Screening mammogram in one year. (Code:FT-U-LHB)

BI-RADS CATEGORY  1: Negative.

## 2020-12-31 ENCOUNTER — Other Ambulatory Visit: Payer: Self-pay | Admitting: Internal Medicine

## 2020-12-31 DIAGNOSIS — Z1231 Encounter for screening mammogram for malignant neoplasm of breast: Secondary | ICD-10-CM

## 2021-01-11 ENCOUNTER — Other Ambulatory Visit (HOSPITAL_COMMUNITY): Payer: Self-pay | Admitting: Internal Medicine

## 2021-01-11 DIAGNOSIS — M545 Low back pain, unspecified: Secondary | ICD-10-CM

## 2021-01-12 ENCOUNTER — Other Ambulatory Visit (HOSPITAL_COMMUNITY): Payer: Self-pay | Admitting: Internal Medicine

## 2021-01-12 DIAGNOSIS — M545 Low back pain, unspecified: Secondary | ICD-10-CM

## 2021-01-14 DIAGNOSIS — M545 Low back pain, unspecified: Secondary | ICD-10-CM | POA: Insufficient documentation

## 2021-01-14 DIAGNOSIS — M25562 Pain in left knee: Secondary | ICD-10-CM | POA: Insufficient documentation

## 2021-01-16 ENCOUNTER — Ambulatory Visit (HOSPITAL_COMMUNITY)
Admission: RE | Admit: 2021-01-16 | Discharge: 2021-01-16 | Disposition: A | Discharge status: Home / Self Care | Payer: 59 | Source: Ambulatory Visit | Attending: Internal Medicine | Admitting: Internal Medicine

## 2021-01-16 DIAGNOSIS — M7071 Other bursitis of hip, right hip: Secondary | ICD-10-CM | POA: Insufficient documentation

## 2021-01-16 DIAGNOSIS — M545 Low back pain, unspecified: Secondary | ICD-10-CM | POA: Insufficient documentation

## 2021-01-16 DIAGNOSIS — M7072 Other bursitis of hip, left hip: Secondary | ICD-10-CM | POA: Insufficient documentation

## 2021-02-02 ENCOUNTER — Ambulatory Visit: Payer: 59

## 2021-02-09 ENCOUNTER — Ambulatory Visit (INDEPENDENT_AMBULATORY_CARE_PROVIDER_SITE_OTHER): Payer: 59 | Admitting: Dermatology

## 2021-02-09 ENCOUNTER — Other Ambulatory Visit: Payer: Self-pay

## 2021-02-09 ENCOUNTER — Encounter: Payer: Self-pay | Admitting: Dermatology

## 2021-02-09 DIAGNOSIS — D485 Neoplasm of uncertain behavior of skin: Secondary | ICD-10-CM

## 2021-02-09 DIAGNOSIS — Z1283 Encounter for screening for malignant neoplasm of skin: Secondary | ICD-10-CM | POA: Diagnosis not present

## 2021-02-09 DIAGNOSIS — L820 Inflamed seborrheic keratosis: Secondary | ICD-10-CM

## 2021-02-09 NOTE — Patient Instructions (Signed)

## 2021-02-17 ENCOUNTER — Encounter: Payer: Self-pay | Admitting: Internal Medicine

## 2021-03-06 ENCOUNTER — Encounter: Payer: Self-pay | Admitting: Dermatology

## 2021-03-06 NOTE — Progress Notes (Signed)
° °  New Patient   Subjective  Gloria Acosta is a 46 y.o. female who presents for the following: Annual Exam (Left thigh-- "changed, raised & flaky).  Annual skin examination, change in spot on left leg Location:  Duration:  Quality:  Associated Signs/Symptoms: Modifying Factors:  Severity:  Timing: Context:    The following portions of the chart were reviewed this encounter and updated as appropriate:  Tobacco   Allergies   Meds   Problems   Med Hx   Surg Hx   Fam Hx       Objective  Well appearing patient in no apparent distress; mood and affect are within normal limits. Limited General skin examination: No atypical moles.  1 possible nonmelanoma skin cancer leg will be biopsied.  Left Thigh - Anterior Hemorrhagic 8 mm crust, dermoscopy favors I SK over SCCA       All sun exposed areas plus back examined.  Plus legs   Assessment & Plan  Neoplasm of uncertain behavior of skin Left Thigh - Anterior  Skin / nail biopsy Type of biopsy: tangential   Informed consent: discussed and consent obtained   Timeout: patient name, date of birth, surgical site, and procedure verified   Anesthesia: the lesion was anesthetized in a standard fashion   Anesthetic:  1% lidocaine w/ epinephrine 1-100,000 local infiltration Instrument used: flexible razor blade   Hemostasis achieved with: aluminum chloride and electrodesiccation   Outcome: patient tolerated procedure well   Post-procedure details: wound care instructions given    Specimen 1 - Surgical pathology Differential Diagnosis: isk pg scc cautery only  Check Margins: No  Encounter for screening for malignant neoplasm of skin  Encouraged to self examine her skin twice annually, continued ultraviolet protection.

## 2021-03-31 ENCOUNTER — Ambulatory Visit (INDEPENDENT_AMBULATORY_CARE_PROVIDER_SITE_OTHER): Payer: 59 | Admitting: Neurology

## 2021-03-31 ENCOUNTER — Encounter: Payer: Self-pay | Admitting: Neurology

## 2021-03-31 VITALS — BP 145/85 | HR 98 | Ht 64.0 in | Wt 187.5 lb

## 2021-03-31 DIAGNOSIS — R52 Pain, unspecified: Secondary | ICD-10-CM

## 2021-03-31 DIAGNOSIS — R202 Paresthesia of skin: Secondary | ICD-10-CM | POA: Diagnosis not present

## 2021-03-31 MED ORDER — DULOXETINE HCL 30 MG PO CPEP: 30.0000 mg | ORAL_CAPSULE | Freq: Every day | ORAL | 0 refills | Status: DC

## 2021-03-31 MED ORDER — DULOXETINE HCL 60 MG PO CPEP: 60.0000 mg | ORAL_CAPSULE | Freq: Every day | ORAL | 11 refills | Status: DC

## 2021-03-31 NOTE — Progress Notes (Signed)
Chief Complaint  Patient presents with   New Patient (Initial Visit)    Rm 15. Alone. PCP is Dr. Redmond School. NP proficient paper referral/r/o MS or other neurological disorder. Pt c/o bilateral leg and lower back pain, states legs feel bruised. C/o progressively worsening pain in legs. Pt reports feeling as if she is walking sideways and pain when walking.      ASSESSMENT AND PLAN  Gloria Acosta is a 46 y.o. female Diffuse body achy pain Upper and lower extremity paresthesia Examination no significant abnormality, history of Addison's disease, on chronic prednisone 20 mg daily She is concerned about the possibility of MS, desire further evaluation,  Proceed with MRI of the brain  Laboratory evaluations including inflammatory markers CPK,  EMG nerve conduction study  Cymbalta 30 mg titrating to 60 mg daily   DIAGNOSTIC DATA (LABS, IMAGING, TESTING) - I reviewed patient records, labs, notes, testing and imaging myself where available.   MEDICAL HISTORY:  Gloria Acosta is a 46 year old female, seen in request by orthopedic surgeon Dr. Gladstone Lighter, Jori Moll, for evaluation of diffuse body achy pain, rule out MS, her primary care physician is Dr. Redmond School, initial evaluation was on March 31, 2021.  I reviewed and summarized the referring note.PMHX. 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  PHYSICAL EXAMNIATION:  Gen: NAD, conversant, well nourised, well groomed                     Cardiovascular: Regular rate rhythm, no peripheral edema, warm, nontender. Eyes: Conjunctivae clear without exudates or hemorrhage Neck: Supple, no carotid bruits. Pulmonary: Clear to auscultation bilaterally   NEUROLOGICAL EXAM:  MENTAL STATUS: Speech:    Speech is normal; fluent and spontaneous with normal comprehension.  Cognition:     Orientation to time, place and person     Normal recent and remote memory     Normal Attention span and concentration     Normal Language, naming, repeating,spontaneous speech     Fund of knowledge   CRANIAL NERVES: CN II: Visual fields are full to confrontation. Pupils are round equal and briskly reactive to light. CN III, IV, VI: extraocular movement are normal. No ptosis. CN V: Facial sensation is intact to light touch CN VII: Face is symmetric with normal eye closure  CN VIII: Hearing is normal to causal conversation. CN IX, X: Phonation is normal. CN XI: Head turning and shoulder shrug are intact  MOTOR: There  is no pronator drift of out-stretched arms. Muscle bulk and tone are normal. Muscle strength is normal.  REFLEXES: Reflexes are 2+ and symmetric at the biceps, triceps, knees, and ankles. Plantar responses are flexor.  SENSORY: Intact to light touch, pinprick and vibratory sensation are intact in fingers and toes.  COORDINATION: There is no trunk or limb dysmetria noted.  GAIT/STANCE: Posture is normal. Gait is steady with normal steps, base, arm  swing, and turning. Heel and toe walking are normal. Tandem gait is normal.  Romberg is absent.  REVIEW OF SYSTEMS:  Full 14 system review of systems performed and notable only for as above All other review of systems were negative.   ALLERGIES: Allergies  Allergen Reactions   Penicillins Other (See Comments)    Told not to take since she was a child, doesn't remember reaction   Sulfa Antibiotics Itching and Rash   Ciprofloxacin     HOME MEDICATIONS: Current Outpatient Medications  Medication Sig Dispense Refill   amphetamine-dextroamphetamine (ADDERALL) 10 MG tablet Take 10 mg by mouth 2 (two) times daily with a meal.     cyclobenzaprine (FLEXERIL) 10 MG tablet Take 10 mg by mouth 3 (three) times daily as needed.     gabapentin (NEURONTIN) 100 MG capsule      HYDROcodone-acetaminophen (NORCO) 10-325 MG per tablet Take 1 tablet by mouth as needed.      Levonorgestrel (KYLEENA) 19.5 MG IUD by Intrauterine route.     meloxicam (MOBIC) 15 MG tablet Take 15 mg by mouth as needed.     Multiple Vitamin (MULTIVITAMIN) tablet Take 1 tablet by mouth daily.     predniSONE (DELTASONE) 20 MG tablet Take 20 mg by mouth 2 (two) times daily with a meal.     No current facility-administered medications for this visit.    PAST MEDICAL HISTORY: Past Medical History:  Diagnosis Date   Addison's disease ([place])    Arthritis    in back   Back pain    2 bulging disc,DJD   Bronchitis    Constipation 07/29/2014   IUD (intrauterine device) in place 03/18/2013   Seen on [place]   Migraines    Pelvic pain in female 07/29/2014   Right ovarian cyst 07/29/2014    PAST SURGICAL HISTORY: Past Surgical History:  Procedure Laterality Date   BREAST BIOPSY Right    CESAREAN SECTION     CHOLECYSTECTOMY     ECTOPIC PREGNANCY SURGERY     IRRIGATION AND DEBRIDEMENT ABSCESS Right 01/09/2017   Procedure: MINOR INCISION AND DRAINAGE OF ABSCESS RIGHT RING FINGER;  Surgeon: Carole Civil, MD;  Location: [facility];   Service: Orthopedics;  Laterality: Right;   OVARIAN CYST REMOVAL     TUBAL LIGATION      FAMILY HISTORY: Family History  Problem Relation Age of Onset   Diabetes Mother    Hypertension Mother    Breast cancer Mother    Cancer Father        brain and liver pancreas   Hypertension Father    Cancer Maternal Aunt    Cancer Paternal Aunt        breast   Breast cancer Paternal Aunt        in her 85s   Cancer Maternal Grandmother        ovarian    Stroke Maternal Grandfather    Diabetes Paternal Grandfather    Heart attack Paternal Grandfather     SOCIAL HISTORY: Social History   Socioeconomic History  Marital status: Married    Spouse name: Not on file   Number of children: 1   Years of education: Not on file   Highest education level: Not on file  Occupational History   Not on file  Tobacco Use   Smoking status: Former    Packs/day: 0.00    Years: 21.00    Pack years: 0.00    Types: Cigarettes   Smokeless tobacco: Never  Vaping Use   Vaping Use: Never used  Substance and Sexual Activity   Alcohol use: Not Currently    Comment: occ. glass of wine   Drug use: No   Sexual activity: Yes    Birth control/protection: I.U.D.  Other Topics Concern   Not on file  Social History Narrative   Not on file   Social Determinants of Health   Financial Resource Strain: Not on file  Food Insecurity: Not on file  Transportation Needs: Not on file  Physical Activity: Not on file  Stress: Not on file  Social Connections: Not on file  Intimate Partner Violence: Not on file      Marcial Pacas, M.D. Ph.D.  [facility] [address], [place], [place] [address] Ph: [phone] Fax: [phone]  CC:  Latanya Maudlin, MD [address],  [place] [address]  Redmond School, MD

## 2021-04-01 LAB — CBC WITH DIFFERENTIAL/PLATELET
Basophils Absolute: 0.1 10*3/uL (ref 0.0–0.2)
Basos: 2 %
EOS (ABSOLUTE): 0.3 10*3/uL (ref 0.0–0.4)
Eos: 5 %
Hematocrit: 39.9 % (ref 34.0–46.6)
Hemoglobin: 13.3 g/dL (ref 11.1–15.9)
Immature Grans (Abs): 0 10*3/uL (ref 0.0–0.1)
Immature Granulocytes: 0 %
Lymphocytes Absolute: 1.8 10*3/uL (ref 0.7–3.1)
Lymphs: 28 %
MCH: 29.2 pg (ref 26.6–33.0)
MCHC: 33.3 g/dL (ref 31.5–35.7)
MCV: 88 fL (ref 79–97)
Monocytes Absolute: 0.6 10*3/uL (ref 0.1–0.9)
Monocytes: 9 %
Neutrophils Absolute: 3.7 10*3/uL (ref 1.4–7.0)
Neutrophils: 56 %
Platelets: 252 10*3/uL (ref 150–450)
RBC: 4.56 x10E6/uL (ref 3.77–5.28)
RDW: 13.2 % (ref 11.7–15.4)
WBC: 6.5 10*3/uL (ref 3.4–10.8)

## 2021-04-01 LAB — CK: Total CK: 64 U/L (ref 32–182)

## 2021-04-01 LAB — HGB A1C W/O EAG: Hgb A1c MFr Bld: 5.6 % (ref 4.8–5.6)

## 2021-04-01 LAB — COMPREHENSIVE METABOLIC PANEL
ALT: 22 IU/L (ref 0–32)
AST: 18 IU/L (ref 0–40)
Albumin/Globulin Ratio: 1.7 (ref 1.2–2.2)
Albumin: 4.5 g/dL (ref 3.8–4.8)
Alkaline Phosphatase: 123 IU/L — ABNORMAL HIGH (ref 44–121)
BUN/Creatinine Ratio: 25 — ABNORMAL HIGH (ref 9–23)
BUN: 16 mg/dL (ref 6–24)
Bilirubin Total: <0.2 mg/dL (ref 0.0–1.2)
CO2: 24 mmol/L (ref 20–29)
Calcium: 9.3 mg/dL (ref 8.7–10.2)
Chloride: 101 mmol/L (ref 96–106)
Creatinine, Ser: 0.65 mg/dL (ref 0.57–1.00)
Globulin, Total: 2.7 g/dL (ref 1.5–4.5)
Glucose: 91 mg/dL (ref 70–99)
Potassium: 4 mmol/L (ref 3.5–5.2)
Sodium: 140 mmol/L (ref 134–144)
Total Protein: 7.2 g/dL (ref 6.0–8.5)
eGFR: 110 mL/min/{1.73_m2} (ref >59)

## 2021-04-01 LAB — FERRITIN: Ferritin: 82 ng/mL (ref 15–150)

## 2021-04-01 LAB — VITAMIN D 25 HYDROXY (VIT D DEFICIENCY, FRACTURES): Vit D, 25-Hydroxy: 32.5 ng/mL (ref 30.0–100.0)

## 2021-04-01 LAB — HIV ANTIBODY (ROUTINE TESTING W REFLEX): HIV Screen 4th Generation wRfx: NONREACTIVE

## 2021-04-01 LAB — SEDIMENTATION RATE: Sed Rate: 7 mm/hr (ref 0–32)

## 2021-04-01 LAB — TSH: TSH: 0.543 u[IU]/mL (ref 0.450–4.500)

## 2021-04-01 LAB — VITAMIN B12: Vitamin B-12: 1006 pg/mL (ref 232–1245)

## 2021-04-01 LAB — RPR: RPR Ser Ql: NONREACTIVE

## 2021-04-01 LAB — FOLATE: Folate: 17.2 ng/mL (ref >3.0)

## 2021-04-01 LAB — ANA W/REFLEX IF POSITIVE: Anti Nuclear Antibody (ANA): NEGATIVE

## 2021-04-01 LAB — C-REACTIVE PROTEIN: CRP: 9 mg/L (ref 0–10)

## 2021-04-04 ENCOUNTER — Telehealth: Payer: Self-pay | Admitting: Neurology

## 2021-04-04 NOTE — Telephone Encounter (Signed)
LVM for pt to call back to schedule  [facility] auth: [id number] (exp. 04/04/21 to 05/19/21)

## 2021-04-05 NOTE — Telephone Encounter (Signed)
Patient returned my call she is scheduled at [facility] for 04/12/21.

## 2021-04-12 ENCOUNTER — Ambulatory Visit: Payer: 59

## 2021-04-12 DIAGNOSIS — R202 Paresthesia of skin: Secondary | ICD-10-CM

## 2021-04-12 DIAGNOSIS — R52 Pain, unspecified: Secondary | ICD-10-CM

## 2021-04-18 ENCOUNTER — Ambulatory Visit: Payer: 59 | Admitting: Dermatology

## 2021-04-18 ENCOUNTER — Ambulatory Visit: Payer: 59 | Admitting: Neurology

## 2021-04-27 ENCOUNTER — Ambulatory Visit (INDEPENDENT_AMBULATORY_CARE_PROVIDER_SITE_OTHER): Payer: 59 | Admitting: Neurology

## 2021-04-27 ENCOUNTER — Other Ambulatory Visit: Payer: Self-pay

## 2021-04-27 DIAGNOSIS — R202 Paresthesia of skin: Secondary | ICD-10-CM

## 2021-04-27 DIAGNOSIS — R52 Pain, unspecified: Secondary | ICD-10-CM | POA: Diagnosis not present

## 2021-04-27 DIAGNOSIS — G5601 Carpal tunnel syndrome, right upper limb: Secondary | ICD-10-CM | POA: Diagnosis not present

## 2021-04-27 NOTE — Progress Notes (Signed)
? ?No chief complaint on file. ? ? ? ? ?ASSESSMENT AND PLAN ? ?Gloria Acosta is a 46 y.o. female ?Diffuse body achy pain ?Upper and lower extremity paresthesia ?EMG nerve conduction study showed mild right carpal tunnel syndromes, no evidence of intrinsic muscle disease, or peripheral neuropathy, ?Long history of Addison's disease, on steroid treatment, ?I have suggested her Cymbalta 30 mg daily for symptomatic treatment, encouraged her moderate exercise, ?We will continue follow-up with his primary care physician ? ? ?DIAGNOSTIC DATA (LABS, IMAGING, TESTING) ?- I reviewed patient records, labs, notes, testing and imaging myself where available. ? ? ?MEDICAL HISTORY: ? ?Gloria Acosta is a 46 year old female, seen in request by orthopedic surgeon Dr. Gladstone Lighter, Jori Moll, for evaluation of diffuse body achy pain, rule out MS, her primary care physician is Dr. Redmond School, initial evaluation was on March 31, 2021. ? ?I reviewed and summarized the referring note.PMHX. ?Cervical decompression in Sept 2022, headache, neck pain, ?Addison' disease,  ? ?Patient was diagnosed with Addison's disease since high school, has been on steroid supplement for many years, currently taking prednisone 20 mg daily ? 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 ?She also has intermittent upper and lower extremity paresthesia ? ?Update April 27, 2021: ?She return for electrodiagnostic study today, which showed mild right carpal tunnel syndromes,  otherwise no evidence of right cervical radiculopathy, right lumbar radiculopathy ? ?She continued complaints of diffuse body achy pain, lower extremity fatigue, achiness, difficulty walking is in tears ? ?I personally reviewed MRI of brain without contrast in February 2023 that was normal ? ?MRI of lumbar spine in November 2022, moderate central disc protrusion at T12-L1, but no evidence of canal stenosis ? ?Reviewed extensive laboratory evaluations in February 2023, which showed normal negative CMP, vitamin D, CBC, ferritin, ANA, A1c, ESR, C-reactive protein, CPK, TSH, B12, HIV, RPR ? ?PHYSICAL EXAM: ?  ?There were no vitals filed for this visit. ? ?Not recorded ?  ? ? ?There is no height or weight on file to calculate BMI. ? ?PHYSICAL EXAMNIATION: ? ?Gen: NAD, conversant, well nourised, well groomed                     ? ?NEUROLOGICAL EXAM: ? ?MENTAL STATUS: ?Speech/cognition: ?Awake, alert, in tears ?  ?CRANIAL NERVES: ?CN II: Visual fields are full to confrontation. Pupils are round equal and briskly reactive to light. ?CN III, IV, VI: extraocular movement are normal. No ptosis. ?CN V: Facial sensation is intact to light touch ?CN VII: Face is symmetric with normal eye closure  ?CN VIII: Hearing is normal to causal conversation. ?CN IX, X: Phonation is normal. ?CN XI: Head turning and shoulder shrug are intact ? ?MOTOR: ?There is no pronator drift of out-stretched arms. Muscle bulk and tone are normal. Muscle strength is normal. ? ?REFLEXES: ?Reflexes are 2+ and symmetric at the biceps, triceps, knees, and ankles. Plantar responses are flexor. ? ?SENSORY: ?Intact to light touch, pinprick and vibratory sensation are intact in  fingers and toes. ? ?COORDINATION: ?There is no trunk or limb dysmetria noted. ? ?GAIT/STANCE: ?Posture is normal. Gait is steady with normal steps, base, arm swing, and turning. Heel and toe walking are normal. Tandem gait is normal.  ?Romberg is absent. ? ?REVIEW OF SYSTEMS:  ?Full 14  system review of systems performed and notable only for as above ?All other review of systems were negative. ? ? ?ALLERGIES: ?Allergies  ?Allergen Reactions  ? Penicillins Other (See Comments)  ?  Told not to take since she was a child, doesn't remember reaction  ? Sulfa Antibiotics Itching and Rash  ? Ciprofloxacin   ? ? ?HOME MEDICATIONS: ?Current Outpatient Medications  ?Medication Sig Dispense Refill  ? amphetamine-dextroamphetamine (ADDERALL) 10 MG tablet Take 10 mg by mouth 2 (two) times daily with a meal.    ? cyclobenzaprine (FLEXERIL) 10 MG tablet Take 10 mg by mouth 3 (three) times daily as needed.    ? DULoxetine (CYMBALTA) 30 MG capsule Take 1 capsule (30 mg total) by mouth daily. 30 capsule 0  ? DULoxetine (CYMBALTA) 60 MG capsule Take 1 capsule (60 mg total) by mouth daily. 30 capsule 11  ? gabapentin (NEURONTIN) 100 MG capsule     ? HYDROcodone-acetaminophen (NORCO) 10-325 MG per tablet Take 1 tablet by mouth as needed.     ? Levonorgestrel (KYLEENA) 19.5 MG IUD by Intrauterine route.    ? meloxicam (MOBIC) 15 MG tablet Take 15 mg by mouth as needed.    ? Multiple Vitamin (MULTIVITAMIN) tablet Take 1 tablet by mouth daily.    ? predniSONE (DELTASONE) 20 MG tablet Take 20 mg by mouth 2 (two) times daily with a meal.    ? ?No current facility-administered medications for this visit.  ? ? ?PAST MEDICAL HISTORY: ?Past Medical History:  ?Diagnosis Date  ? Addison's disease ([facility])   ? Arthritis   ? in back  ? Back pain   ? 2 bulging disc,DJD  ? Bronchitis   ? Constipation 07/29/2014  ? IUD (intrauterine device) in place 03/18/2013  ? Seen on [place]  ? Migraines   ? Pelvic pain in female 07/29/2014  ? Right ovarian cyst 07/29/2014  ? ? ?PAST SURGICAL HISTORY: ?Past Surgical History:  ?Procedure Laterality Date  ? BREAST BIOPSY Right   ? CESAREAN SECTION    ? CHOLECYSTECTOMY    ? ECTOPIC PREGNANCY SURGERY    ? IRRIGATION AND DEBRIDEMENT ABSCESS Right 01/09/2017  ? Procedure: MINOR INCISION AND DRAINAGE OF ABSCESS RIGHT  RING FINGER;  Surgeon: Harrison, Stanley E, MD;  Location: [facility];  Service: Orthopedics;  Laterality: Right;  ? OVARIAN CYST REMOVAL    ? TUBAL LIGATION    ? ? ?FAMILY HISTORY: ?Family History  ?Problem Relation Age of Onset  ? Diabetes Mother   ? Hypertension Mother   ? Breast cancer Mother   ? Cancer Father   ?     brain and liver pancreas  ? Hypertension Father   ? Cancer Maternal Aunt   ? Cancer Paternal Aunt   ?     breast  ? Breast cancer Paternal Aunt   ?     in her 40s  ? Cancer Maternal Grandmother   ?     ovarian   ? Stroke Maternal Grandfather   ? Diabetes Paternal Grandfather   ? Heart attack Paternal Grandfather   ? ? ?SOCIAL HISTORY: ?Social History  ? ?Socioeconomic History  ? Marital status: Married  ?  Spouse name: Not   on file  ? Number of children: 1  ? Years of education: Not on file  ? Highest education level: Not on file  ?Occupational History  ? Not on file  ?Tobacco Use  ? Smoking status: Former  ?  Packs/day: 0.00  ?  Years: 21.00  ?  Pack years: 0.00  ?  Types: Cigarettes  ? Smokeless tobacco: Never  ?Vaping Use  ? Vaping Use: Never used  ?Substance and Sexual Activity  ? Alcohol use: Not Currently  ?  Comment: occ. glass of wine  ? Drug use: No  ? Sexual activity: Yes  ?  Birth control/protection: I.U.D.  ?Other Topics Concern  ? Not on file  ?Social History Narrative  ? Not on file  ? ?Social Determinants of Health  ? ?Financial Resource Strain: Not on file  ?Food Insecurity: Not on file  ?Transportation Needs: Not on file  ?Physical Activity: Not on file  ?Stress: Not on file  ?Social Connections: Not on file  ?Intimate Partner Violence: Not on file  ? ? ? ? ?Yijun Yan, M.D. Ph.D. ? ?[facility] ?[address], [address] ?[place], [place] [address] ?Ph: [phone] ?Fax: [phone] ? ?CC:  Fusco, Lawrence, MD ?[address] ?[place],  [place] [address]  Fusco, Lawrence, MD   ?

## 2021-05-10 NOTE — Procedures (Signed)
? ? ? ?   ?Full Name: Gloria Acosta Gender: Female ?MRN #: [id number] Date of Birth: October 02, 1975 ?   ?Visit Date: 04/27/2021 08:33 ?Age: 46 Years ?Examining Physician: Marcial Pacas, MD  ?Referring Physician: Marcial Pacas, MD ?Height: 5 feet 4 inch ?Patient History: 46 year old female presented with intermittent bilateral hands and lower extremity paresthesia, ? ?Summary of the test: ? ?Nerve conduction study: ?Right sural, superficial peroneal sensory responses were normal.  Right tibial, peroneal to EDB motor responses were normal. ? ?Right median sensory response showed mildly prolonged peak latency with normal snap amplitude.  Left median sensory response showed within normal range peak latency and snap amplitude.  Left median and ulnar mixed study showed no difference. ? ?Bilateral median motor responses were within normal limit. ? ?Right ulnar sensory and motor responses were normal. ? ?Electromyography: ?Selected needle examination of right upper and lower extremity, right cervical and lumbosacral paraspinal muscles showed no significant abnormalities. ?   ?Conclusion: ?This is an abnormal study.  There is electrodiagnostic evidence of mild right carpal tunnel syndrome.  There is no evidence of right cervical radiculopathy or left carpal tunnel syndrome.  There is no evidence of large fiber peripheral neuropathy. ? ? ? ?------------------------------- ?Marcial Pacas, M.D. PhD ? ?[facility] ?[address], [address] ?[place], [place] [address] ?Tel: [phone] ?Fax: [phone] ? ?Verbal informed consent was obtained from the patient, patient was informed of potential risk of procedure, including bruising, bleeding, hematoma formation, infection, muscle weakness, muscle pain, numbness, among others. ?   ? ?   ?[name] ?   ?Nerve / Sites Muscle Latency Ref. Amplitude Ref. Rel Amp Segments Distance Velocity Ref. Area  ?  ms ms mV mV %  cm m/s m/s mVms  ?R Median - APB  ?   Wrist APB 4.1 ?4.4 7.4 ?4.0 100 Wrist -  APB 7   27.7  ?   Upper arm APB 8.3  7.5  101 Upper arm - Wrist 21 50 ?49 27.9  ?L Median - APB  ?   Wrist APB 3.4 ?4.4 8.4 ?4.0 100 Wrist - APB 7   30.1  ?   Upper arm APB 7.4  8.4  99.3 Upper arm - Wrist 21 53 ?49 29.4  ?R Ulnar - ADM  ?   Wrist ADM 2.7 ?3.3 10.5 ?6.0 100 Wrist - ADM 7   38.4  ?   B.Elbow ADM 5.5  10.1  96.8 B.Elbow - Wrist 18 64 ?49 37.6  ?   A.Elbow ADM 7.1  9.9  97.3 A.Elbow - B.Elbow 10 63 ?49 37.5  ?R Peroneal - EDB  ?   Ankle EDB 3.8 ?6.5 6.4 ?2.0 100 Ankle - EDB 9   21.4  ?   Fib head EDB 10.2  5.9  92.2 Fib head - Ankle 28 44 ?44 19.6  ?   Pop fossa EDB 12.4  5.5  94.3 Pop fossa - Fib head 10 44 ?44 19.8  ?       Pop fossa - Ankle      ?R Tibial - AH  ?   Ankle AH 2.5 ?5.8 6.6 ?4.0 100 Ankle - AH 9   15.6  ?   Pop fossa AH 11.0  4.5  68 Pop fossa - Ankle 38 45 ?41 15.0  ?             ? ?[name] ?   ?Nerve / Sites Rec. Site Peak Lat Ref.  Amp Ref. Segments Distance  Peak Diff Ref.  ?  ms ms ?V ?V  cm ms ms  ?R Sural - Ankle (Calf)  ?   Calf Ankle 3.8 ?4.4 17 ?6 Calf - Ankle 14    ?R Superficial peroneal - Ankle  ?   Lat leg Ankle 3.1 ?4.4 10 ?6 Lat leg - Ankle 14    ?L Median, Ulnar - Transcarpal comparison  ?   Median Palm Wrist 2.0 ?2.2 38 ?35 Median Palm - Wrist 8    ?   Ulnar Palm Wrist 2.1 ?2.2 29 ?12 Ulnar Palm - Wrist 8    ?      Median Palm - Ulnar Palm  -0.0 ?0.4  ?R Median - Orthodromic (Dig II, Mid palm)  ?   Dig II Wrist 4.1 ?3.4 15 ?10 Dig II - Wrist 13    ?L Median - Orthodromic (Dig II, Mid palm)  ?   Dig II Wrist 3.4 ?3.4 17 ?10 Dig II - Wrist 13    ?R Ulnar - Orthodromic, (Dig V, Mid palm)  ?   Dig V Wrist 2.7 ?3.1 8 ?5 Dig V - Wrist 11    ?               ?F  Wave ?   ?Nerve F Lat Ref.  ? ms ms  ?R Tibial - AH 50.1 ?56.0  ?R Ulnar - ADM 26.0 ?32.0  ?       ?EMG Summary Table   ? Spontaneous MUAP Recruitment  ?Muscle IA Fib PSW Fasc Other Amp Dur. Poly Pattern  ?R. Tibialis anterior Normal None None None _______ Normal Normal Normal Normal  ?R. Tibialis posterior Normal None None  None _______ Normal Normal Normal Normal  ?R. Peroneus longus Normal None None None _______ Normal Normal Normal Normal  ?R. Gastrocnemius (Medial head) Normal None None None _______ Normal Normal Normal Normal  ?R. Vastus lateralis Normal None None None _______ Normal Normal Normal Normal  ?R. Lumbar paraspinals (low) Normal None None None _______ Normal Normal Normal Normal  ?R. Lumbar paraspinals (mid) Normal None None None _______ Normal Normal Normal Normal  ?R. Cervical paraspinals Normal None None None _______ Normal Normal Normal Normal  ?R. First dorsal interosseous Normal None None None _______ Normal Normal Normal Normal  ?R. Pronator teres Normal None None None _______ Normal Normal Normal Normal  ?R. Biceps brachii Normal None None None _______ Normal Normal Normal Normal  ?R. Brachioradialis Normal None None None _______ Normal Normal Normal Normal  ? ?  ?

## 2022-01-03 ENCOUNTER — Other Ambulatory Visit: Payer: Self-pay | Admitting: Internal Medicine

## 2022-01-03 DIAGNOSIS — Z1231 Encounter for screening mammogram for malignant neoplasm of breast: Secondary | ICD-10-CM

## 2022-01-06 ENCOUNTER — Ambulatory Visit
Admission: RE | Admit: 2022-01-06 | Discharge: 2022-01-06 | Disposition: A | Discharge status: Home / Self Care | Payer: 59 | Source: Ambulatory Visit | Attending: Internal Medicine | Admitting: Internal Medicine

## 2022-01-06 DIAGNOSIS — Z1231 Encounter for screening mammogram for malignant neoplasm of breast: Secondary | ICD-10-CM

## 2022-03-23 ENCOUNTER — Other Ambulatory Visit (HOSPITAL_COMMUNITY): Payer: Self-pay | Admitting: Internal Medicine

## 2022-03-23 DIAGNOSIS — Z136 Encounter for screening for cardiovascular disorders: Secondary | ICD-10-CM

## 2022-03-30 ENCOUNTER — Ambulatory Visit (HOSPITAL_COMMUNITY)
Admission: RE | Admit: 2022-03-30 | Discharge: 2022-03-30 | Disposition: A | Discharge status: Home / Self Care | Payer: 59 | Source: Ambulatory Visit | Attending: Internal Medicine | Admitting: Internal Medicine

## 2022-03-30 DIAGNOSIS — Z136 Encounter for screening for cardiovascular disorders: Secondary | ICD-10-CM | POA: Insufficient documentation

## 2022-06-02 ENCOUNTER — Other Ambulatory Visit (HOSPITAL_COMMUNITY): Payer: Self-pay | Admitting: Internal Medicine

## 2022-06-02 DIAGNOSIS — M79604 Pain in right leg: Secondary | ICD-10-CM

## 2022-06-02 DIAGNOSIS — R600 Localized edema: Secondary | ICD-10-CM

## 2022-07-03 ENCOUNTER — Ambulatory Visit (HOSPITAL_COMMUNITY)
Admission: RE | Admit: 2022-07-03 | Discharge: 2022-07-03 | Disposition: A | Discharge status: Home / Self Care | Payer: 59 | Source: Ambulatory Visit | Attending: Internal Medicine | Admitting: Internal Medicine

## 2022-07-03 ENCOUNTER — Ambulatory Visit (HOSPITAL_COMMUNITY): Payer: 59

## 2022-07-03 DIAGNOSIS — M79605 Pain in left leg: Secondary | ICD-10-CM | POA: Insufficient documentation

## 2022-07-03 DIAGNOSIS — R600 Localized edema: Secondary | ICD-10-CM | POA: Diagnosis not present

## 2022-07-03 DIAGNOSIS — M79604 Pain in right leg: Secondary | ICD-10-CM | POA: Insufficient documentation

## 2022-07-05 ENCOUNTER — Ambulatory Visit (HOSPITAL_COMMUNITY)
Admission: RE | Admit: 2022-07-05 | Discharge: 2022-07-05 | Disposition: A | Discharge status: Home / Self Care | Payer: 59 | Source: Ambulatory Visit | Attending: Internal Medicine | Admitting: Internal Medicine

## 2022-07-05 DIAGNOSIS — M79604 Pain in right leg: Secondary | ICD-10-CM | POA: Diagnosis present

## 2022-07-05 DIAGNOSIS — R600 Localized edema: Secondary | ICD-10-CM | POA: Insufficient documentation

## 2022-07-05 DIAGNOSIS — M79605 Pain in left leg: Secondary | ICD-10-CM | POA: Diagnosis present

## 2022-07-28 ENCOUNTER — Ambulatory Visit (HOSPITAL_COMMUNITY)
Admission: RE | Admit: 2022-07-28 | Discharge: 2022-07-28 | Disposition: A | Discharge status: Home / Self Care | Payer: 59 | Source: Ambulatory Visit | Attending: Internal Medicine | Admitting: Internal Medicine

## 2022-07-28 ENCOUNTER — Other Ambulatory Visit (HOSPITAL_COMMUNITY): Payer: Self-pay | Admitting: Internal Medicine

## 2022-07-28 DIAGNOSIS — M79604 Pain in right leg: Secondary | ICD-10-CM | POA: Insufficient documentation

## 2022-07-28 DIAGNOSIS — M79605 Pain in left leg: Secondary | ICD-10-CM | POA: Diagnosis present

## 2022-07-31 ENCOUNTER — Other Ambulatory Visit (HOSPITAL_COMMUNITY): Payer: Self-pay | Admitting: Internal Medicine

## 2022-07-31 DIAGNOSIS — R1900 Intra-abdominal and pelvic swelling, mass and lump, unspecified site: Secondary | ICD-10-CM

## 2022-08-01 ENCOUNTER — Encounter: Payer: Self-pay | Admitting: Vascular Surgery

## 2022-08-01 ENCOUNTER — Ambulatory Visit (INDEPENDENT_AMBULATORY_CARE_PROVIDER_SITE_OTHER): Payer: 59 | Admitting: Vascular Surgery

## 2022-08-01 VITALS — BP 150/84 | HR 84 | Temp 97.8°F | Resp 16 | Ht 64.0 in | Wt 204.0 lb

## 2022-08-01 DIAGNOSIS — I739 Peripheral vascular disease, unspecified: Secondary | ICD-10-CM

## 2022-08-01 NOTE — Progress Notes (Signed)
Patient name: Gloria Acosta MRN: [id number] DOB: 1975/07/30 Sex: female  REASON FOR CONSULT: Possible LE arterial stenosis  HPI: Gloria Acosta is a 47 y.o. female, with hx HTN and chronic back pain that presents for evaluation of possible lower extremity arterial stenosis.  She describes pain in her buttock when sitting as well as pain in the both thighs.  She feels this is worse at night.  Particularly worse when she lays on her side.  She really is unable to walk because of it.  More of the pain is in her thighs than in her calves.  Her PCP did start her on Pletal and she got headaches and then was transition to pentoxifylline with some improvement  She had ABIs on 07/05/2022 that were noncompressible.  There was evidence of monophasic dorsalis pedis waveforms as well as normal triphasic waveforms in the posterior tibial arteries.  Past Medical History:  Diagnosis Date   Addison's disease (HCC)    Arthritis    in back   Back pain    2 bulging disc,DJD   Bronchitis    Constipation 07/29/2014   IUD (intrauterine device) in place 03/18/2013   Seen on [place]   Migraines    Pelvic pain in female 07/29/2014   Right ovarian cyst 07/29/2014    Past Surgical History:  Procedure Laterality Date   BREAST BIOPSY Right    CESAREAN SECTION     CHOLECYSTECTOMY     ECTOPIC PREGNANCY SURGERY     IRRIGATION AND DEBRIDEMENT ABSCESS Right 01/09/2017   Procedure: MINOR INCISION AND DRAINAGE OF ABSCESS RIGHT RING FINGER;  Surgeon: Vickki Hearing, MD;  Location: [facility];  Service: Orthopedics;  Laterality: Right;   OVARIAN CYST REMOVAL     TUBAL LIGATION      Family History  Problem Relation Age of Onset   Diabetes Mother    Hypertension Mother    Breast cancer Mother    Cancer Father        brain and liver pancreas   Hypertension Father    Cancer Maternal Aunt    Cancer Paternal Aunt        breast   Breast cancer Paternal Aunt        in her 24s   Cancer Maternal Grandmother        ovarian     Stroke Maternal Grandfather    Diabetes Paternal Grandfather    Heart attack Paternal Grandfather     SOCIAL HISTORY: Social History   Socioeconomic History   Marital status: Married    Spouse name: Not on file   Number of children: 1   Years of education: Not on file   Highest education level: Not on file  Occupational History   Not on file  Tobacco Use   Smoking status: Former    Packs/day: 0.00    Years: 21.00    Additional pack years: 0.00    Total pack years: 0.00    Types: Cigarettes   Smokeless tobacco: Never  Vaping Use   Vaping Use: Never used  Substance and Sexual Activity   Alcohol use: Not Currently    Comment: occ. glass of wine   Drug use: No   Sexual activity: Yes    Birth control/protection: I.U.D.  Other Topics Concern   Not on file  Social History Narrative   Not on file   Social Determinants of Health   Financial Resource Strain: Not on file  Food Insecurity: Not on  file  Transportation Needs: Not on file  Physical Activity: Not on file  Stress: Not on file  Social Connections: Not on file  Intimate Partner Violence: Not on file    Allergies  Allergen Reactions   Penicillins Other (See Comments)    Told not to take since she was a child, doesn't remember reaction   Sulfa Antibiotics Itching and Rash   Ciprofloxacin     Current Outpatient Medications  Medication Sig Dispense Refill   amphetamine-dextroamphetamine (ADDERALL) 10 MG tablet Take 10 mg by mouth 2 (two) times daily with a meal.     aspirin EC 81 MG tablet Take 81 mg by mouth daily.     cyclobenzaprine (FLEXERIL) 10 MG tablet Take 10 mg by mouth 3 (three) times daily as needed.     HYDROcodone-acetaminophen (NORCO) 10-325 MG per tablet Take 1 tablet by mouth as needed.      Levonorgestrel (KYLEENA) 19.5 MG IUD by Intrauterine route.     Multiple Vitamin (MULTIVITAMIN) tablet Take 1 tablet by mouth daily.     PENTOXIFYLLINE ER PO Take 400 mg by mouth 3 (three) times daily.      predniSONE (DELTASONE) 20 MG tablet Take 20 mg by mouth 2 (two) times daily with a meal.     rosuvastatin (CRESTOR) 10 MG tablet Take 10 mg by mouth at bedtime.     DULoxetine (CYMBALTA) 30 MG capsule Take 1 capsule (30 mg total) by mouth daily. 30 capsule 0   DULoxetine (CYMBALTA) 60 MG capsule Take 1 capsule (60 mg total) by mouth daily. 30 capsule 11   gabapentin (NEURONTIN) 100 MG capsule      meloxicam (MOBIC) 15 MG tablet Take 15 mg by mouth as needed.     No current facility-administered medications for this visit.    REVIEW OF SYSTEMS:  [X]  denotes positive finding, [ ]  denotes negative finding Cardiac  Comments:  Chest pain or chest pressure:    Shortness of breath upon exertion:    Short of breath when lying flat:    Irregular heart rhythm:        Vascular    Pain in calf, thigh, or hip brought on by ambulation:    Pain in feet at night that wakes you up from your sleep:     Blood clot in your veins:    Leg swelling:         Pulmonary    Oxygen at home:    Productive cough:     Wheezing:         Neurologic    Sudden weakness in arms or legs:     Sudden numbness in arms or legs:     Sudden onset of difficulty speaking or slurred speech:    Temporary loss of vision in one eye:     Problems with dizziness:         Gastrointestinal    Blood in stool:     Vomited blood:         Genitourinary    Burning when urinating:     Blood in urine:        Psychiatric    Major depression:         Hematologic    Bleeding problems:    Problems with blood clotting too easily:        Skin    Rashes or ulcers:        Constitutional    Fever or chills:  PHYSICAL EXAM: Vitals:   08/01/22 1411  BP: (!) 150/84  Pulse: 84  Resp: 16  Temp: 97.8 F (36.6 C)  TempSrc: Temporal  SpO2: 99%  Weight: 204 lb (92.5 kg)  Height: 5\' 4"  (1.626 m)    GENERAL: The patient is a well-nourished female, in no acute distress. The vital signs are documented  above. CARDIAC: There is a regular rate and rhythm.  VASCULAR:  Palpable femoral pulses both groins Palpable PT pulses bilaterally at the ankle No lower extremity tissue loss Feet warm PULMONARY: No respiratory distress. ABDOMEN: Soft and non-tender. MUSCULOSKELETAL: There are no major deformities or cyanosis. NEUROLOGIC: No focal weakness or paresthesias are detected. SKIN: There are no ulcers or rashes noted. PSYCHIATRIC: The patient has a normal affect.  DATA:     Assessment/Plan:  47 year old female presents for evaluation of possible lower extremity arterial stenosis and etiology for her lower buttock and leg pain.  Most of her pain is while at rest including laying on her side and at night.  This is not necessarily exercise-induced.  This does not sound like classic vascular claudication.  In addition she has palpable posterior tibial pulses at the ankle with normal triphasic waveforms as demonstrated above.  Her ABIs were noncompressible with some abnormal waveforms in the dorsalis pedis but she has plenty of arterial perfusion at baseline.  I do not feel arterial insufficiency explains her symptoms particular with more symptoms in the thigh at rest and given she has pulsatile flow at the ankle.  She is quite concerned about what is going on.  I offered follow-up in 1 year with repeat ABIs but again I do not feel this is the etiology of her symptoms at this time.  She has follow-up with her spine surgeon which seems appropriate for further evaluation.   Cephus Shelling, MD [facility] Office: [phone]

## 2022-08-03 ENCOUNTER — Other Ambulatory Visit: Payer: Self-pay

## 2022-08-03 DIAGNOSIS — I739 Peripheral vascular disease, unspecified: Secondary | ICD-10-CM

## 2022-08-03 NOTE — Progress Notes (Signed)
Wrong exam ordered MD office is aware and will place corrected order and get new Pre-Auth. MD office will call centralized scheduling and schedule corrected exam and contact patient with new date/time.  Patient was called, no answer  VM was left for her.

## 2022-08-04 ENCOUNTER — Ambulatory Visit (HOSPITAL_COMMUNITY)
Admission: RE | Admit: 2022-08-04 | Discharge: 2022-08-04 | Disposition: A | Discharge status: Home / Self Care | Payer: 59 | Source: Ambulatory Visit | Attending: Internal Medicine | Admitting: Internal Medicine

## 2022-08-04 ENCOUNTER — Encounter (HOSPITAL_COMMUNITY): Payer: Self-pay

## 2022-08-30 ENCOUNTER — Other Ambulatory Visit (HOSPITAL_COMMUNITY): Payer: Self-pay | Admitting: Internal Medicine

## 2022-08-30 DIAGNOSIS — M79604 Pain in right leg: Secondary | ICD-10-CM

## 2022-08-30 DIAGNOSIS — G8929 Other chronic pain: Secondary | ICD-10-CM

## 2022-09-13 ENCOUNTER — Ambulatory Visit (HOSPITAL_BASED_OUTPATIENT_CLINIC_OR_DEPARTMENT_OTHER): Admission: RE | Admit: 2022-09-13 | Payer: 59 | Source: Ambulatory Visit

## 2022-09-13 ENCOUNTER — Encounter (HOSPITAL_BASED_OUTPATIENT_CLINIC_OR_DEPARTMENT_OTHER): Payer: Self-pay

## 2022-09-13 ENCOUNTER — Ambulatory Visit (HOSPITAL_BASED_OUTPATIENT_CLINIC_OR_DEPARTMENT_OTHER): Payer: 59

## 2022-09-23 ENCOUNTER — Ambulatory Visit (HOSPITAL_BASED_OUTPATIENT_CLINIC_OR_DEPARTMENT_OTHER)
Admission: RE | Admit: 2022-09-23 | Discharge: 2022-09-23 | Disposition: A | Discharge status: Home / Self Care | Payer: 59 | Source: Ambulatory Visit | Attending: Internal Medicine | Admitting: Internal Medicine

## 2022-09-23 DIAGNOSIS — M79605 Pain in left leg: Secondary | ICD-10-CM | POA: Diagnosis present

## 2022-09-23 DIAGNOSIS — M79604 Pain in right leg: Secondary | ICD-10-CM | POA: Diagnosis not present

## 2022-09-30 ENCOUNTER — Ambulatory Visit (HOSPITAL_BASED_OUTPATIENT_CLINIC_OR_DEPARTMENT_OTHER)
Admission: RE | Admit: 2022-09-30 | Discharge: 2022-09-30 | Disposition: A | Discharge status: Home / Self Care | Payer: 59 | Source: Ambulatory Visit | Attending: Internal Medicine | Admitting: Internal Medicine

## 2022-09-30 DIAGNOSIS — G8929 Other chronic pain: Secondary | ICD-10-CM | POA: Diagnosis present

## 2022-09-30 DIAGNOSIS — R102 Pelvic and perineal pain: Secondary | ICD-10-CM | POA: Insufficient documentation

## 2022-11-09 ENCOUNTER — Ambulatory Visit (INDEPENDENT_AMBULATORY_CARE_PROVIDER_SITE_OTHER): Payer: 59 | Admitting: Adult Health

## 2022-11-09 ENCOUNTER — Encounter: Payer: Self-pay | Admitting: Adult Health

## 2022-11-09 ENCOUNTER — Other Ambulatory Visit (HOSPITAL_COMMUNITY)
Admission: RE | Admit: 2022-11-09 | Discharge: 2022-11-09 | Disposition: A | Discharge status: Home / Self Care | Payer: 59 | Source: Ambulatory Visit | Attending: Adult Health | Admitting: Adult Health

## 2022-11-09 VITALS — BP 136/73 | HR 86 | Ht 64.0 in | Wt 204.0 lb

## 2022-11-09 DIAGNOSIS — Z975 Presence of (intrauterine) contraceptive device: Secondary | ICD-10-CM | POA: Diagnosis not present

## 2022-11-09 DIAGNOSIS — N912 Amenorrhea, unspecified: Secondary | ICD-10-CM

## 2022-11-09 DIAGNOSIS — N941 Unspecified dyspareunia: Secondary | ICD-10-CM

## 2022-11-09 DIAGNOSIS — R102 Pelvic and perineal pain: Secondary | ICD-10-CM

## 2022-11-09 DIAGNOSIS — Z124 Encounter for screening for malignant neoplasm of cervix: Secondary | ICD-10-CM | POA: Diagnosis present

## 2022-11-09 DIAGNOSIS — Z8041 Family history of malignant neoplasm of ovary: Secondary | ICD-10-CM

## 2022-11-09 DIAGNOSIS — N83201 Unspecified ovarian cyst, right side: Secondary | ICD-10-CM

## 2022-11-09 NOTE — Progress Notes (Signed)
Subjective:     Patient ID: Gloria Acosta, female   DOB: 1975-07-04, 47 y.o.   MRN: [id number]  HPI Gloria Acosta is a 47 year old white female,married, G2P10011, in having pelvic pain and pain with sex and had [place] that showed 4.6 right ovarian cyst.  And she needs a pap. She has [place] IUD and has not a period in over 7 years . Her grandma died with ovarian cancer in her early 35's.   PCP is Dr Sherwood Gambler.  Review of Systems +pelvic pain +pain with sex No period in over 7 years Reviewed past medical,surgical, social and family history. Reviewed medications and allergies.     Objective:   Physical Exam BP 136/73 (BP Location: Left Arm, Patient Position: Sitting, Cuff Size: Normal)   Pulse 86   Ht 5\' 4"  (1.626 m)   Wt 204 lb (92.5 kg)   BMI 35.02 kg/m     Skin warm and dry. Lungs: clear to ausculation bilaterally. Cardiovascular: regular rate and rhythm.   Pelvic: external genitalia is normal in appearance no lesions, vagina: scant discharge without odor,urethra has no lesions or masses noted, cervix: bulbous, +IUD strings at os, pap with HR HPV genotyping performed, uterus: normal size, shape and contour, mildly tender, no masses felt, adnexa: no masses, + RLQ tenderness noted. Bladder is non tender and no masses felt. Vaginal sidewall on the right is tender. Reviewed [place] with her: IMPRESSION: 1. 4.6 cm cyst arises from the right ovary, accounting for the partly imaged cystic structure noted on the current MRI. No follow up imaging recommended. Note: This recommendation does not apply to premenarchal patients or to those with increased risk (genetic, family history, elevated tumor markers or other high-risk factors) of ovarian cancer. Reference: Radiology 2019 Nov; 293(2):359-371. 2. Small echogenic foci along the margins of the endometrium could reflect calcifications or air. This warrants clinical correlation. 3. Well-positioned IUD. 4. Left ovary not visualized.  AA is 3 Fall risk is  moderate    11/09/2022   10:13 AM 01/20/2020    4:00 PM 09/23/2018   11:55 AM  Depression screen PHQ 2/9  Decreased Interest 0 0 0  Down, Depressed, Hopeless 0 0 0  PHQ - 2 Score 0 0 0  Altered sleeping 0  0  Tired, decreased energy 3  0  Change in appetite 1  0  Feeling bad or failure about yourself  0  0  Trouble concentrating 1  0  Moving slowly or fidgety/restless 0  0  Suicidal thoughts 0  0  PHQ-9 Score 5  0       11/09/2022   10:14 AM  GAD 7 : Generalized Anxiety Score  Nervous, Anxious, on Edge 0  Control/stop worrying 0  Worry too much - different things 0  Trouble relaxing 0  Restless 0  Easily annoyed or irritable 0  Afraid - awful might happen 0  Total GAD 7 Score 0      Upstream - 11/09/22 1011       Pregnancy Intention Screening   Does the patient want to become pregnant in the next year? No    Does the patient's partner want to become pregnant in the next year? No    Would the patient like to discuss contraceptive options today? No      Contraception Wrap Up   Current Method IUD or IUS    End Method IUD or IUS    Contraception Counseling Provided No  Examination chaperoned by Malachy Mood LPN  Assessment:    1. Routine Papanicolaou smear Pap sent Pap in 3 years if normal  - Cytology - PAP( [place])  2. Right ovarian cyst Has 4.6 cm cyst right ovary on [place] 09/30/22 Will check CA 125  - CA 125  3. IUD (intrauterine device) in place Kyleena placed 08/14/16, expired in 2023, no period in over 7 years  Does not want removed today, will check FSH to see if PM   4. Dyspareunia in female +pain with sex   5. Pelvic pain in female +pelvic pain - CA 125  6. Family history of ovarian cancer Grandma died in early 36's with ovarian cancer  - CA 125  7. No periods No periods in over 7 years, has IUD but it is expired, will check FSH to see if PM now - Follicle stimulating hormone      Plan:     Return 12/07/22 to discuss  options with Dr Charlotta Newton

## 2022-11-10 LAB — CYTOLOGY - PAP
Adequacy: ABSENT
Comment: NEGATIVE
Diagnosis: NEGATIVE
High risk HPV: NEGATIVE

## 2022-11-10 LAB — CA 125: Cancer Antigen (CA) 125: 13.1 U/mL (ref 0.0–38.1)

## 2022-11-10 LAB — FOLLICLE STIMULATING HORMONE: FSH: 121 m[IU]/mL

## 2022-12-07 ENCOUNTER — Encounter: Payer: Self-pay | Admitting: Obstetrics & Gynecology

## 2022-12-07 ENCOUNTER — Ambulatory Visit (INDEPENDENT_AMBULATORY_CARE_PROVIDER_SITE_OTHER): Payer: 59 | Admitting: Obstetrics & Gynecology

## 2022-12-07 VITALS — BP 126/79 | HR 76 | Ht 64.0 in | Wt 204.6 lb

## 2022-12-07 DIAGNOSIS — Z30432 Encounter for removal of intrauterine contraceptive device: Secondary | ICD-10-CM | POA: Diagnosis not present

## 2022-12-07 NOTE — Progress Notes (Signed)
   GYN VISIT Patient name: Gloria Acosta MRN [id number]  Date of birth: Aug 10, 1975 Chief Complaint:   Follow-up  History of Present Illness:   Gloria Acosta is a 47 y.o. G51P0011 female being seen today for IUD removal- Kyleena placed 35yrs ago.  Pt has not had a period for many years.  FSH suggestive of menopause.  Reports no acute gyn concerns.     No LMP recorded. (Menstrual status: IUD).    Review of Systems:   Pertinent items are noted in HPI Denies fever/chills, dizziness, headaches, visual disturbances, fatigue, shortness of breath, chest pain, abdominal pain, vomiting, no problems with periods, bowel movements, urination, or intercourse unless otherwise stated above.  Pertinent History Reviewed:   Past Surgical History:  Procedure Laterality Date   BREAST BIOPSY Right    CESAREAN SECTION     CHOLECYSTECTOMY     ECTOPIC PREGNANCY SURGERY     IRRIGATION AND DEBRIDEMENT ABSCESS Right 01/09/2017   Procedure: MINOR INCISION AND DRAINAGE OF ABSCESS RIGHT RING FINGER;  Surgeon: Vickki Hearing, MD;  Location: [facility];  Service: Orthopedics;  Laterality: Right;   NECK SURGERY  10/2019   OVARIAN CYST REMOVAL     TUBAL LIGATION      Past Medical History:  Diagnosis Date   Addison's disease (HCC)    Arthritis    in back   Back pain    2 bulging disc,DJD   Bronchitis    Constipation 07/29/2014   IUD (intrauterine device) in place 03/18/2013   Seen on [place]   Migraines    Pelvic pain in female 07/29/2014   Right ovarian cyst 07/29/2014   Reviewed problem list, medications and allergies. Physical Assessment:   Vitals:   12/07/22 1459  BP: 126/79  Pulse: 76  Weight: 204 lb 9.6 oz (92.8 kg)  Height: 5\' 4"  (1.626 m)  Body mass index is 35.12 kg/m.       Physical Examination:   General appearance: alert, well appearing, and in no distress  Psych: mood appropriate, normal affect  Skin: warm & dry   Cardiovascular: normal heart rate noted  Respiratory: normal respiratory  effort, no distress  Abdomen: soft, non-tender   Pelvic: VULVA: normal appearing vulva with no masses, tenderness or lesions, VAGINA: normal appearing vagina with normal color and discharge, no lesions, CERVIX: normal appearing cervix without discharge or lesions- strings visualized at os  Extremities: no edema   Chaperone: Faith Rogue     IUD REMOVAL   Time out was performed.  A sterile speculum was placed in the vagina.  The cervix was visualized, and the strings  visible. They were grasped and the [facility]  IUD was easily removed intact without complications. The patient tolerated the procedure well.     Assessment & Plan:  1) IUD removal, Menopause -discussed removal and monitoring of her menses vs IUD replacement -questions/concerns addressed, plan for removal today and monitoring of potential menses -IUD removed without issues see above procedure -f/u prn or 17yr annual   Return in about 1 year (around 12/07/2023) for Annual.   [name], DO Attending Obstetrician & Gynecologist, [facility] for [facility], [facility]

## 2023-03-07 ENCOUNTER — Other Ambulatory Visit (HOSPITAL_COMMUNITY): Payer: Self-pay | Admitting: Internal Medicine

## 2023-03-07 DIAGNOSIS — Z1231 Encounter for screening mammogram for malignant neoplasm of breast: Secondary | ICD-10-CM

## 2023-03-08 ENCOUNTER — Other Ambulatory Visit: Payer: Self-pay | Admitting: Internal Medicine

## 2023-03-08 DIAGNOSIS — Z1231 Encounter for screening mammogram for malignant neoplasm of breast: Secondary | ICD-10-CM

## 2023-03-09 ENCOUNTER — Ambulatory Visit (INDEPENDENT_AMBULATORY_CARE_PROVIDER_SITE_OTHER): Payer: No Typology Code available for payment source | Admitting: Obstetrics & Gynecology

## 2023-03-09 VITALS — BP 156/91 | HR 82

## 2023-03-09 DIAGNOSIS — D270 Benign neoplasm of right ovary: Secondary | ICD-10-CM | POA: Diagnosis not present

## 2023-03-09 NOTE — Progress Notes (Signed)
 Follow up appointment for results: MRI  Chief Complaint  Patient presents with   Ovarian Cyst    Blood pressure (!) 156/91, pulse 82.  Narrative & Impression  CLINICAL DATA:  Partly imaged cystic lesion the right adnexa on recent lumbar MRI. Further assessment.   EXAM: TRANSABDOMINAL AND TRANSVAGINAL ULTRASOUND OF PELVIS   TECHNIQUE: Both transabdominal and transvaginal ultrasound examinations of the pelvis were performed. Transabdominal technique was performed for global imaging of the pelvis including uterus, ovaries, adnexal regions, and pelvic cul-de-sac. It was necessary to proceed with endovaginal exam following the transabdominal exam to visualize the endometrium and ovaries/adnexa to better advantage.   COMPARISON:  Lumbar MRI, 07/28/2022   FINDINGS: Uterus   Measurements: 6.4 x 2.5 x 2.7 cm = volume: 22.2 mL. No mass. Echogenic foci are noted along margins of the endometrium, which may reflect calcifications or air, but are nonspecific.   Endometrium   Thickness: 3 mm.  IUD well positioned in the endometrial canal.   Right ovary   Measurements: 5.5 x 4.3 x 4.6 cm = Ovary mildly enlarged by a dominant cyst that measures 4.6 x 4.3 x 4.4 cm. Normal color Doppler blood flow to the ovarian tissue. No adnexal masses.   Left ovary   Not visualized.  No left adnexal mass.   Other findings   No abnormal free fluid.   IMPRESSION: 1. 4.6 cm cyst arises from the right ovary, accounting for the partly imaged cystic structure noted on the current MRI. No follow up imaging recommended. Note: This recommendation does not apply to premenarchal patients or to those with increased risk (genetic, family history, elevated tumor markers or other high-risk factors) of ovarian cancer. Reference: Radiology 2019 Nov; 293(2):359-371. 2. Small echogenic foci along the margins of the endometrium could reflect calcifications or air. This warrants clinical correlation. 3.  Well-positioned IUD. 4. Left ovary not visualized.     Electronically Signed   By: Alm Parkins M.D.   On: 10/06/2022 15:00    She has had an increase in the size from 8/24 of 4.6 cm to 6.7 on 02/18/23 MRI abvout 50% increase in size  CA 125 normal 12  Likely a mucinous or serous cystadenoma(Benign ovarian neoplasm)  MEDS ordered this encounter: No orders of the defined types were placed in this encounter.   Orders for this encounter: No orders of the defined types were placed in this encounter.   Impression + Management Plan   ICD-10-CM   1. Benign epithelial neoplasm of right ovary: enlarging 4.6 > 6.7 cm with normal CA 125  D27.0      Schedule a RA TLH BSO for 04/11/23   Follow Up: Return in about 6 weeks (around 04/20/2023) for Post Op, with Dr Jayne.     All questions were answered.  Past Medical History:  Diagnosis Date   Addison's disease (HCC)    Arthritis    in back   Back pain    2 bulging disc,DJD   Bronchitis    Constipation 07/29/2014   IUD (intrauterine device) in place 03/18/2013   Seen on US    Migraines    Pelvic pain in female 07/29/2014   Right ovarian cyst 07/29/2014    Past Surgical History:  Procedure Laterality Date   BREAST BIOPSY Right    CESAREAN SECTION     CHOLECYSTECTOMY     ECTOPIC PREGNANCY SURGERY     IRRIGATION AND DEBRIDEMENT ABSCESS Right 01/09/2017   Procedure: MINOR INCISION AND DRAINAGE OF ABSCESS  RIGHT RING FINGER;  Surgeon: Margrette Taft BRAVO, MD;  Location: [facility];  Service: Orthopedics;  Laterality: Right;   NECK SURGERY  10/2019   OVARIAN CYST REMOVAL     TUBAL LIGATION      OB History     Gravida  2   Para  1   Term      Preterm      AB  1   Living  1      SAB      IAB      Ectopic  1   Multiple      Live Births  1           Allergies  Allergen Reactions   Penicillins Other (See Comments)    Told not to take since she was a child, doesn't remember reaction   Sulfa Antibiotics Itching  and Rash   Ciprofloxacin    Duloxetine  Hcl Nausea Only    Social History   Socioeconomic History   Marital status: Married    Spouse name: Not on file   Number of children: 1   Years of education: Not on file   Highest education level: Not on file  Occupational History   Not on file  Tobacco Use   Smoking status: Former    Current packs/day: 0.00    Types: Cigarettes   Smokeless tobacco: Never  Vaping Use   Vaping status: Never Used  Substance and Sexual Activity   Alcohol use: Yes    Comment: occ. glass of wine   Drug use: No   Sexual activity: Not Currently    Birth control/protection: I.U.D., Surgical    Comment: has 1 tube  Other Topics Concern   Not on file  Social History Narrative   Not on file   Social Drivers of Health   Financial Resource Strain: Low Risk  (11/09/2022)   Overall Financial Resource Strain (CARDIA)    Difficulty of Paying Living Expenses: Not hard at all  Food Insecurity: No Food Insecurity (11/09/2022)   Hunger Vital Sign    Worried About Running Out of Food in the Last Year: Never true    Ran Out of Food in the Last Year: Never true  Transportation Needs: No Transportation Needs (11/09/2022)   PRAPARE - Administrator, Civil Service (Medical): No    Lack of Transportation (Non-Medical): No  Physical Activity: Insufficiently Active (11/09/2022)   Exercise Vital Sign    Days of Exercise per Week: 3 days    Minutes of Exercise per Session: 20 min  Stress: No Stress Concern Present (11/09/2022)   [employer] of Occupational Health - Occupational Stress Questionnaire    Feeling of Stress : Only a little  Social Connections: Socially Integrated (11/09/2022)   Social Connection and Isolation Panel [NHANES]    Frequency of Communication with Friends and Family: More than three times a week    Frequency of Social Gatherings with Friends and Family: More than three times a week    Attends Religious Services: More than 4 times per  year    Active Member of [employer] or Organizations: Yes    Attends Banker Meetings: 1 to 4 times per year    Marital Status: Married    Family History  Problem Relation Age of Onset   Diabetes Mother    Hypertension Mother    Breast cancer Mother    Cancer Father        brain and liver  pancreas   Hypertension Father    Cancer Maternal Aunt    Cancer Paternal Aunt        breast   Breast cancer Paternal Aunt        in her 48s   Cancer Maternal Grandmother        ovarian    Stroke Maternal Grandfather    Diabetes Paternal Grandfather    Heart attack Paternal Grandfather

## 2023-03-19 ENCOUNTER — Ambulatory Visit (HOSPITAL_COMMUNITY): Payer: No Typology Code available for payment source

## 2023-03-21 ENCOUNTER — Ambulatory Visit: Payer: No Typology Code available for payment source

## 2023-03-22 ENCOUNTER — Ambulatory Visit
Admission: RE | Admit: 2023-03-22 | Discharge: 2023-03-22 | Disposition: A | Discharge status: Home / Self Care | Payer: No Typology Code available for payment source | Source: Ambulatory Visit | Attending: Internal Medicine | Admitting: Internal Medicine

## 2023-03-22 DIAGNOSIS — Z1231 Encounter for screening mammogram for malignant neoplasm of breast: Secondary | ICD-10-CM

## 2023-03-27 ENCOUNTER — Other Ambulatory Visit: Payer: Self-pay | Admitting: Internal Medicine

## 2023-03-27 DIAGNOSIS — R928 Other abnormal and inconclusive findings on diagnostic imaging of breast: Secondary | ICD-10-CM

## 2023-04-10 ENCOUNTER — Ambulatory Visit
Admission: RE | Admit: 2023-04-10 | Discharge: 2023-04-10 | Disposition: A | Discharge status: Home / Self Care | Payer: No Typology Code available for payment source | Source: Ambulatory Visit | Attending: Internal Medicine | Admitting: Internal Medicine

## 2023-04-10 DIAGNOSIS — R928 Other abnormal and inconclusive findings on diagnostic imaging of breast: Secondary | ICD-10-CM

## 2023-04-20 ENCOUNTER — Encounter: Payer: No Typology Code available for payment source | Admitting: Obstetrics & Gynecology

## 2024-01-18 NOTE — H&P (Signed)
 Preoperative Preadmission History and Physical  Gloria Acosta is an 48 y.o. female. Who presents for scheduled surgery - laparoscopic bilateral salpingo-oophorectomy for treatment of persistent 8cm R ovarian cyst (normal CA125). She is postmenopausal.  Pertinent Gynecological History: Menses: post-menopausal Contraception: none DES exposure: unknown Blood transfusions: none Sexually transmitted diseases: no past history Previous GYN Procedures: surgical treatment of ectopic pregnancy, ovarian cystectomy  Last mammogram: normal Date: 02/2023 Last pap: normal Date: 10/2022 OB History: G2, P1   Menstrual History: No LMP recorded. (Menstrual status: IUD).    Past Medical History:  Diagnosis Date   Addison's disease (HCC)    Arthritis    in back   Back pain    2 bulging disc,DJD   Bronchitis    Constipation 07/29/2014   IUD (intrauterine device) in place 03/18/2013   Seen on US    Migraines    Pelvic pain in female 07/29/2014   Right ovarian cyst 07/29/2014    Past Surgical History:  Procedure Laterality Date   BREAST BIOPSY Right    CESAREAN SECTION     CHOLECYSTECTOMY     ECTOPIC PREGNANCY SURGERY     IRRIGATION AND DEBRIDEMENT ABSCESS Right 01/09/2017   Procedure: MINOR INCISION AND DRAINAGE OF ABSCESS RIGHT RING FINGER;  Surgeon: Margrette Taft BRAVO, MD;  Location: [facility];  Service: Orthopedics;  Laterality: Right;   NECK SURGERY  10/2019   OVARIAN CYST REMOVAL     TUBAL LIGATION      Family History  Problem Relation Age of Onset   Diabetes Mother    Hypertension Mother    Cancer Father        brain and liver pancreas   Hypertension Father    Cancer Maternal Aunt    Cancer Paternal Aunt        breast   Breast cancer Paternal Aunt        in her 27s   Cancer Maternal Grandmother        ovarian    Stroke Maternal Grandfather    Diabetes Paternal Grandfather    Heart attack Paternal Grandfather     Social History:  reports that she has quit smoking. Her smoking  use included cigarettes. She has never used smokeless tobacco. She reports current alcohol use. She reports that she does not use drugs.  Allergies:  Allergies  Allergen Reactions   Penicillins Other (See Comments)    Told not to take since she was a child, doesn't remember reaction   Sulfa Antibiotics Itching and Rash   Ciprofloxacin Nausea And Vomiting   Duloxetine  Hcl Nausea Only    No medications prior to admission.   Vitals to be taken on admission There were no vitals taken for this visit.  Physical exam from office visit Chaperone Chaperone: present  Constitutional *General Appearance: healthy-appearing, well-nourished, well-developed  Abdomen *Inspection/Palpation/Auscultation: non-distended, no tenderness, no rebound  Female Genitalia Vulva: no masses, no atrophy, no lesions Labia Majora: normal Labia Minora: normal Introitus: narrowed/contracted *Vagina: no discharge, atrophy moderate, pelvic floor muscles tender __ (perineal muscles exquisitely tender to palpation) Vaginal discharge no abnormal discharge present *Cervix: grossly normal  Neurological System Impressions: motor: no deficits, sensory: no deficits  Psychiatric *Orientation: to person, to place, to time *Mood and Affect: active and alert, normal mood, normal affect  Assessment/Plan: 48yo G2P1011 who presents for laparoscopic bilateral salpingo-oophorectomy for treatment of persistent 8cm R ovarian cyst (normal CA125). She is postmenopausal and thus decision was made to pursue BSO. TVUS previously -  Was  previously 7cm - now 8x7cm. RT OV WITH CYSTS CONTAINING INTERNAL ECHOES = 8.1 X 7.1 CM, 2.1 X 1.9 CM. NO BLOOD FLOW SEEN WITHIN CYSTS. BLOOD FLOW IS SEEN TO OVARY LT OV WITH SIMPLE APPEARING CYST = 1.0 X 0.8 CM NO ADNEXAL MASSES OR FF SEEN Hx of ectopic pregnancy - thinks was right tube, had surgical tx (?salpingectomy) She understands that any laparoscopic procedure has risk of needing to be  performed open if difficulty controlling bleeding or visualizing structures. R/b/a discussed with patient. She understands risks of bleeding, infection, and injury to surrounding structures. Plan discharge from PACU.  Slater JINNY Door 01/18/2024, 8:36 AM

## 2024-01-21 ENCOUNTER — Other Ambulatory Visit: Payer: Self-pay

## 2024-01-21 ENCOUNTER — Encounter (HOSPITAL_COMMUNITY): Payer: Self-pay | Admitting: Obstetrics and Gynecology

## 2024-01-21 NOTE — Progress Notes (Signed)
 SDW call  Patient was given pre-op instructions over the phone. Patient verbalized understanding of instructions provided.     PCP - Rosina Bullock, PA Cardiologist -  Pulmonary:    PPM/ICD - denies Device Orders - na Rep Notified - na   Chest x-ray - na EKG -  na Stress Test - 02/21/2018 ECHO - 12/08/2019 Cardiac Cath -  PFT: 11/17/2019  Sleep Study/sleep apnea/CPAP: denies  Non-diabetic  Blood Thinner Instructions: denies Aspirin Instructions:stats last dose 01/20/2024   ERAS Protcol - Clears until 0430   Anesthesia review: No   Patient denies shortness of breath, fever, cough and chest pain over the phone call  Your procedure is scheduled on Tuesday January 21, 2024  Report to [facility] Main Entrance A at  0530  A.M., then check in with the Admitting office.  Call this number if you have problems the morning of surgery:  [phone]   If you have any questions prior to your surgery date call [phone]: Open Monday-Friday 8am-4pm If you experience any cold or flu symptoms such as cough, fever, chills, shortness of breath, etc. between now and your scheduled surgery, please notify us  at the above number     Remember:  Do not eat after midnight the night before your surgery  You may drink clear liquids until  0430   the morning of your surgery.   Clear liquids allowed are: Water, Non-Citrus Juices (without pulp), Carbonated Beverages, Clear Tea, Black Coffee ONLY (NO MILK, CREAM OR POWDERED CREAMER of any kind), and Gatorade   Take these medicines the morning of surgery with A SIP OF WATER:  gabapentin  As needed: oxycodone   As of today, STOP taking any Aspirin (unless otherwise instructed by your surgeon) Aleve, Naproxen, Ibuprofen , Motrin , Advil , Goody's, BC's, all herbal medications, fish oil, and all vitamins.

## 2024-01-21 NOTE — Anesthesia Preprocedure Evaluation (Signed)
 Anesthesia Evaluation  Patient identified by MRN, date of birth, ID band Patient awake    Reviewed: Allergy & Precautions, NPO status , Patient's Chart, lab work & pertinent test results  History of Anesthesia Complications Negative for: history of anesthetic complications  Airway Mallampati: II  TM Distance: >3 FB Neck ROM: Full    Dental  (+) Dental Advisory Given, Chipped   Pulmonary former smoker   Pulmonary exam normal        Cardiovascular hypertension, Pt. on medications + Peripheral Vascular Disease  Normal cardiovascular exam     Neuro/Psych  Headaches  Neuromuscular disease  negative psych ROS   GI/Hepatic negative GI ROS, Neg liver ROS,,,  Endo/Other   Addison's disease   Renal/GU negative Renal ROS     Musculoskeletal  (+) Arthritis ,  narcotic dependent  Abdominal   Peds  Hematology negative hematology ROS (+)   Anesthesia Other Findings   Reproductive/Obstetrics                              Anesthesia Physical Anesthesia Plan  ASA: 2  Anesthesia Plan: General   Post-op Pain Management: Tylenol  PO (pre-op)* and Ketamine IV*   Induction: Intravenous  PONV Risk Score and Plan: 3 and Treatment may vary due to age or medical condition, Ondansetron , Dexamethasone  and Midazolam   Airway Management Planned: Oral ETT  Additional Equipment: None  Intra-op Plan:   Post-operative Plan: Extubation in OR  Informed Consent: I have reviewed the patients History and Physical, chart, labs and discussed the procedure including the risks, benefits and alternatives for the proposed anesthesia with the patient or authorized representative who has indicated his/her understanding and acceptance.     Dental advisory given  Plan Discussed with: CRNA and Anesthesiologist  Anesthesia Plan Comments:          Anesthesia Quick Evaluation

## 2024-01-22 ENCOUNTER — Encounter (HOSPITAL_COMMUNITY): Admitting: Registered Nurse

## 2024-01-22 ENCOUNTER — Ambulatory Visit (HOSPITAL_BASED_OUTPATIENT_CLINIC_OR_DEPARTMENT_OTHER): Admitting: Registered Nurse

## 2024-01-22 ENCOUNTER — Encounter (HOSPITAL_COMMUNITY)
Admission: RE | Disposition: A | Discharge status: Home / Self Care | Payer: Self-pay | Source: Home / Self Care | Attending: Obstetrics and Gynecology

## 2024-01-22 ENCOUNTER — Ambulatory Visit (HOSPITAL_COMMUNITY)
Admission: RE | Admit: 2024-01-22 | Discharge: 2024-01-22 | Disposition: A | Discharge status: Home / Self Care | Attending: Obstetrics and Gynecology | Admitting: Obstetrics and Gynecology

## 2024-01-22 ENCOUNTER — Encounter (HOSPITAL_COMMUNITY): Payer: Self-pay | Admitting: Obstetrics and Gynecology

## 2024-01-22 DIAGNOSIS — D270 Benign neoplasm of right ovary: Secondary | ICD-10-CM | POA: Diagnosis not present

## 2024-01-22 DIAGNOSIS — N83201 Unspecified ovarian cyst, right side: Secondary | ICD-10-CM | POA: Diagnosis present

## 2024-01-22 DIAGNOSIS — M479 Spondylosis, unspecified: Secondary | ICD-10-CM | POA: Insufficient documentation

## 2024-01-22 DIAGNOSIS — I10 Essential (primary) hypertension: Secondary | ICD-10-CM | POA: Insufficient documentation

## 2024-01-22 DIAGNOSIS — D271 Benign neoplasm of left ovary: Secondary | ICD-10-CM | POA: Insufficient documentation

## 2024-01-22 DIAGNOSIS — Z79891 Long term (current) use of opiate analgesic: Secondary | ICD-10-CM | POA: Insufficient documentation

## 2024-01-22 DIAGNOSIS — I739 Peripheral vascular disease, unspecified: Secondary | ICD-10-CM | POA: Diagnosis not present

## 2024-01-22 DIAGNOSIS — N838 Other noninflammatory disorders of ovary, fallopian tube and broad ligament: Secondary | ICD-10-CM | POA: Insufficient documentation

## 2024-01-22 DIAGNOSIS — Z87891 Personal history of nicotine dependence: Secondary | ICD-10-CM | POA: Diagnosis not present

## 2024-01-22 HISTORY — PX: LAPAROSCOPIC UNILATERAL SALPINGECTOMY: SHX5934

## 2024-01-22 HISTORY — PX: LAPAROSCOPIC OVARIAN CYSTECTOMY: SHX6248

## 2024-01-22 LAB — CBC
HCT: 39.2 % (ref 36.0–46.0)
Hemoglobin: 12.6 g/dL (ref 12.0–15.0)
MCH: 28.4 pg (ref 26.0–34.0)
MCHC: 32.1 g/dL (ref 30.0–36.0)
MCV: 88.3 fL (ref 80.0–100.0)
Platelets: 201 K/uL (ref 150–400)
RBC: 4.44 MIL/uL (ref 3.87–5.11)
RDW: 13 % (ref 11.5–15.5)
WBC: 7.2 K/uL (ref 4.0–10.5)
nRBC: 0 % (ref 0.0–0.2)

## 2024-01-22 LAB — TYPE AND SCREEN
ABO/RH(D): O POS
Antibody Screen: NEGATIVE

## 2024-01-22 LAB — ABO/RH: ABO/RH(D): O POS

## 2024-01-22 SURGERY — EXCISION, CYST, OVARY, LAPAROSCOPIC
Anesthesia: General | Site: Pelvis | Laterality: Right

## 2024-01-22 MED ORDER — POVIDONE-IODINE 10 % EX SWAB
2.0000 | Freq: Once | CUTANEOUS | Status: AC
  Administered 2024-01-22: 2 via TOPICAL

## 2024-01-22 MED ORDER — HYDROMORPHONE HCL 1 MG/ML IJ SOLN
INTRAMUSCULAR | Status: DC | PRN
Start: 2024-01-22 — End: 2024-01-22
  Administered 2024-01-22: .5 mg via INTRAVENOUS

## 2024-01-22 MED ORDER — ORAL CARE MOUTH RINSE: 15.0000 mL | Freq: Once | OROMUCOSAL | Status: AC

## 2024-01-22 MED ORDER — BUPIVACAINE HCL (PF) 0.25 % IJ SOLN
INTRAMUSCULAR | Status: DC | PRN
  Administered 2024-01-22: 15 mL

## 2024-01-22 MED ORDER — IBUPROFEN 600 MG PO TABS: 600.0000 mg | ORAL_TABLET | Freq: Four times a day (QID) | ORAL | 0 refills | Status: AC | PRN

## 2024-01-22 MED ORDER — FENTANYL CITRATE (PF) 100 MCG/2ML IJ SOLN
INTRAMUSCULAR | Status: AC
  Filled 2024-01-22: qty 2

## 2024-01-22 MED ORDER — PHENYLEPHRINE 80 MCG/ML (10ML) SYRINGE FOR IV PUSH (FOR BLOOD PRESSURE SUPPORT)
PREFILLED_SYRINGE | INTRAVENOUS | Status: DC | PRN
Start: 2024-01-22 — End: 2024-01-22
  Administered 2024-01-22: 80 ug via INTRAVENOUS

## 2024-01-22 MED ORDER — LACTATED RINGERS IV SOLN: INTRAVENOUS | Status: DC

## 2024-01-22 MED ORDER — ROCURONIUM BROMIDE 10 MG/ML (PF) SYRINGE
PREFILLED_SYRINGE | INTRAVENOUS | Status: DC | PRN
  Administered 2024-01-22: 70 mg via INTRAVENOUS

## 2024-01-22 MED ORDER — MIDAZOLAM HCL (PF) 2 MG/2ML IJ SOLN
INTRAMUSCULAR | Status: DC | PRN
Start: 2024-01-22 — End: 2024-01-22
  Administered 2024-01-22: 2 mg via INTRAVENOUS

## 2024-01-22 MED ORDER — DEXMEDETOMIDINE HCL IN NACL 80 MCG/20ML IV SOLN
INTRAVENOUS | Status: DC | PRN
  Administered 2024-01-22 (×2): 8 ug via INTRAVENOUS
  Administered 2024-01-22: 4 ug via INTRAVENOUS

## 2024-01-22 MED ORDER — ACETAMINOPHEN 500 MG PO TABS: 500.0000 mg | ORAL_TABLET | Freq: Four times a day (QID) | ORAL | 0 refills | Status: AC | PRN

## 2024-01-22 MED ORDER — FENTANYL CITRATE (PF) 250 MCG/5ML IJ SOLN
INTRAMUSCULAR | Status: DC | PRN
  Administered 2024-01-22: 100 ug via INTRAVENOUS
  Administered 2024-01-22 (×2): 50 ug via INTRAVENOUS

## 2024-01-22 MED ORDER — SUGAMMADEX SODIUM 200 MG/2ML IV SOLN
INTRAVENOUS | Status: DC | PRN
  Administered 2024-01-22: 200 mg via INTRAVENOUS

## 2024-01-22 MED ORDER — PHENYLEPHRINE HCL-NACL 20-0.9 MG/250ML-% IV SOLN
INTRAVENOUS | Status: DC | PRN
  Administered 2024-01-22: 20 ug/min via INTRAVENOUS

## 2024-01-22 MED ORDER — OXYCODONE HCL 5 MG/5ML PO SOLN: 5.0000 mg | Freq: Once | ORAL | Status: AC | PRN

## 2024-01-22 MED ORDER — BUPIVACAINE HCL (PF) 0.25 % IJ SOLN
INTRAMUSCULAR | Status: AC
Start: 2024-01-22 — End: 2024-01-22
  Filled 2024-01-22: qty 30

## 2024-01-22 MED ORDER — OXYCODONE HCL 5 MG PO TABS
ORAL_TABLET | ORAL | Status: AC
  Filled 2024-01-22: qty 1

## 2024-01-22 MED ORDER — FENTANYL CITRATE (PF) 100 MCG/2ML IJ SOLN
25.0000 ug | INTRAMUSCULAR | Status: DC | PRN
  Administered 2024-01-22: 50 ug via INTRAVENOUS
  Administered 2024-01-22 (×2): 25 ug via INTRAVENOUS

## 2024-01-22 MED ORDER — AMISULPRIDE (ANTIEMETIC) 5 MG/2ML IV SOLN: 10.0000 mg | Freq: Once | INTRAVENOUS | Status: DC | PRN

## 2024-01-22 MED ORDER — CELECOXIB 200 MG PO CAPS
400.0000 mg | ORAL_CAPSULE | ORAL | Status: AC
  Administered 2024-01-22: 400 mg via ORAL
  Filled 2024-01-22: qty 2

## 2024-01-22 MED ORDER — OXYCODONE HCL 5 MG PO TABS
5.0000 mg | ORAL_TABLET | Freq: Once | ORAL | Status: AC | PRN
  Administered 2024-01-22: 5 mg via ORAL

## 2024-01-22 MED ORDER — DEXAMETHASONE SOD PHOSPHATE PF 10 MG/ML IJ SOLN
INTRAMUSCULAR | Status: DC | PRN
  Administered 2024-01-22: 10 mg via INTRAVENOUS

## 2024-01-22 MED ORDER — OXYCODONE HCL 5 MG PO TABS: 5.0000 mg | ORAL_TABLET | ORAL | 0 refills | Status: AC | PRN

## 2024-01-22 MED ORDER — CHLORHEXIDINE GLUCONATE 0.12 % MT SOLN
15.0000 mL | Freq: Once | OROMUCOSAL | Status: AC
  Administered 2024-01-22: 15 mL via OROMUCOSAL
  Filled 2024-01-22: qty 15

## 2024-01-22 MED ORDER — ACETAMINOPHEN 500 MG PO TABS
1000.0000 mg | ORAL_TABLET | ORAL | Status: AC
  Administered 2024-01-22: 1000 mg via ORAL
  Filled 2024-01-22: qty 2

## 2024-01-22 MED ORDER — 0.9 % SODIUM CHLORIDE (POUR BTL) OPTIME
TOPICAL | Status: DC | PRN
  Administered 2024-01-22: 1000 mL

## 2024-01-22 MED ORDER — HYDROMORPHONE HCL 1 MG/ML IJ SOLN
INTRAMUSCULAR | Status: AC
  Filled 2024-01-22: qty 0.5

## 2024-01-22 MED ORDER — SODIUM CHLORIDE 0.9 % IV SOLN: 12.5000 mg | INTRAVENOUS | Status: DC | PRN

## 2024-01-22 MED ORDER — LIDOCAINE 2% (20 MG/ML) 5 ML SYRINGE
INTRAMUSCULAR | Status: DC | PRN
Start: 2024-01-22 — End: 2024-01-22
  Administered 2024-01-22: 100 mg via INTRAVENOUS

## 2024-01-22 MED ORDER — PROPOFOL 10 MG/ML IV BOLUS
INTRAVENOUS | Status: AC
  Filled 2024-01-22: qty 20

## 2024-01-22 MED ORDER — ONDANSETRON HCL 4 MG/2ML IJ SOLN
INTRAMUSCULAR | Status: DC | PRN
  Administered 2024-01-22: 4 mg via INTRAVENOUS

## 2024-01-22 MED ORDER — POLYETHYLENE GLYCOL 3350 17 G PO PACK: 17.0000 g | PACK | Freq: Every day | ORAL | 0 refills | Status: AC

## 2024-01-22 MED ORDER — MIDAZOLAM HCL 2 MG/2ML IJ SOLN
INTRAMUSCULAR | Status: AC
  Filled 2024-01-22: qty 2

## 2024-01-22 MED ORDER — PROPOFOL 10 MG/ML IV BOLUS
INTRAVENOUS | Status: DC | PRN
  Administered 2024-01-22: 130 mg via INTRAVENOUS

## 2024-01-22 SURGICAL SUPPLY — 32 items
BENZOIN TINCTURE PRP APPL 2/3 (GAUZE/BANDAGES/DRESSINGS) IMPLANT
CHLORAPREP W/TINT 26 (MISCELLANEOUS) ×4 IMPLANT
COVER MAYO STAND STRL (DRAPES) ×4 IMPLANT
DEFOGGER SCOPE WARM SEASHARP (MISCELLANEOUS) ×4 IMPLANT
DERMABOND ADVANCED .7 DNX12 (GAUZE/BANDAGES/DRESSINGS) ×4 IMPLANT
DRAPE SURG IRRIG POUCH 19X23 (DRAPES) ×4 IMPLANT
DRSG TRANSPARENT 2.4X2.8 (GAUZE/BANDAGES/DRESSINGS) IMPLANT
GAUZE SPONGE 2X2 STRL 8-PLY (GAUZE/BANDAGES/DRESSINGS) IMPLANT
GLOVE BIOGEL PI IND STRL 6 (GLOVE) ×8 IMPLANT
GLOVE SS PI 5.5 STRL (GLOVE) ×4 IMPLANT
GOWN STRL REUS W/TWL LRG LVL3 (GOWN DISPOSABLE) ×4 IMPLANT
IRRIGATION SUCT STRKRFLW 2 WTP (MISCELLANEOUS) IMPLANT
KIT PINK PAD W/HEAD ARM REST (MISCELLANEOUS) ×4 IMPLANT
KIT TURNOVER KIT B (KITS) ×4 IMPLANT
LIGASURE VESSEL 5MM BLUNT TIP (ELECTROSURGICAL) ×4 IMPLANT
PACK LAPAROSCOPY BASIN (CUSTOM PROCEDURE TRAY) ×4 IMPLANT
POUCH LAPAROSCOPIC INSTRUMENT (MISCELLANEOUS) ×4 IMPLANT
POWDER SURGICEL 3.0 GRAM (HEMOSTASIS) IMPLANT
SCISSORS LAP 5X35 DISP (ENDOMECHANICALS) IMPLANT
SET TUBE SMOKE EVAC HIGH FLOW (TUBING) ×4 IMPLANT
SOLN 0.9% NACL POUR BTL 1000ML (IV SOLUTION) ×4 IMPLANT
STRIP CLOSURE SKIN 1/2X4 (GAUZE/BANDAGES/DRESSINGS) IMPLANT
SUT VIC AB 4-0 PS2 18 (SUTURE) ×4 IMPLANT
SUT VICRYL 0 UR6 27IN ABS (SUTURE) IMPLANT
SYR 50ML LL SCALE MARK (SYRINGE) IMPLANT
SYSTEM RETRIEVL 5MM INZII UNIV (BASKET) IMPLANT
TIP ENDOSCOPIC SURGICEL (TIP) IMPLANT
TOWEL GREEN STERILE FF (TOWEL DISPOSABLE) ×4 IMPLANT
TRAY FOLEY W/BAG SLVR 14FR (SET/KITS/TRAYS/PACK) ×4 IMPLANT
TROCAR ADV FIXATION 5X100MM (TROCAR) ×8 IMPLANT
TROCAR Z-THREAD OPTICAL 5X100M (TROCAR) ×4 IMPLANT
UNDERPAD 30X36 HEAVY ABSORB (UNDERPADS AND DIAPERS) ×4 IMPLANT

## 2024-01-22 NOTE — Anesthesia Procedure Notes (Signed)
 Procedure Name: Intubation Date/Time: 01/22/2024 7:40 AM  Performed by: Virgil Ee, CRNAPre-anesthesia Checklist: Patient identified, Patient being monitored, Timeout performed, Emergency Drugs available and Suction available Patient Re-evaluated:Patient Re-evaluated prior to induction Oxygen Delivery Method: Circle system utilized Preoxygenation: Pre-oxygenation with 100% oxygen Induction Type: IV induction Ventilation: Mask ventilation without difficulty Laryngoscope Size: Mac and 3 Grade View: Grade I Tube type: Oral Tube size: 7.0 mm Number of attempts: 1 Airway Equipment and Method: Stylet Placement Confirmation: ETT inserted through vocal cords under direct vision, positive ETCO2 and breath sounds checked- equal and bilateral Secured at: 21 cm Tube secured with: Tape Dental Injury: Teeth and Oropharynx as per pre-operative assessment

## 2024-01-22 NOTE — Interval H&P Note (Signed)
 History and Physical Interval Note:  01/22/2024 7:16 AM  Gloria Acosta  has presented today for surgery, with the diagnosis of cyst of right ovary.  The various methods of treatment have been discussed with the patient and family. After consideration of risks, benefits and other options for treatment, the patient has consented to  Procedure(s): SALPINGO-OOPHORECTOMY, BILATERAL, LAPAROSCOPIC (Bilateral) as a surgical intervention.  The patient's history has been reviewed, patient examined, no change in status, stable for surgery.  I have reviewed the patient's chart and labs.  Questions were answered to the patient's satisfaction.     Slater JINNY Door

## 2024-01-22 NOTE — Op Note (Signed)
 PREOPERATIVE DIAGNOSES: 1. Right ovarian cyst  POSTOPERATIVE DIAGNOSES: Same  PROCEDURE PERFORMED: Laparoscopic bilateral oophorectomy, L salpingectomy  SURGEON: Dr. Slater Door  ANESTHESIA: GET  ESTIMATED BLOOD LOSS: minimal  COMPLICATIONS: None  TUBES: None.  DRAINS: None  PATHOLOGY: Left fallopian tube and ovary, right ovary and cyst  FINDINGS: On exam, under anesthesia, normal appearing vulva and vagina, normal-sized anteverted uterus. Upper abdominal survey normal. Uterus with small sub cm posterior fundal fibroid. Right fallopian tube surgically absent, large 8cm cyst filled with serous fluid. Right fallopian tube and ovary normal-appearing.  Procedure: The patient was consented and seen preoperatively. Consent was signed and witnessed prior to procedure start with all questions answered. Antibiotics were not indicated. She was taken to the operating room, and placed under general anesthesia with pneumatic compression stockings in place on her lower extremities. The patient was then placed in a dorsal lithotomy position with Allen stirrups. The patient was prepped and draped in the normal sterile fashion. A time-out was performed with everyone in agreement.   A Foley catheter was inserted with yellow urine found to be draining appropriate. A sterile speculum was placed and a sponge stick was placed for manipulation.  The skin in and around the umbilicus was injected with 0.05% Bupivacaine  plain. A 5mm skin incision was made at the base of the umbilicus. The 5mm Optiview trocar with rounded tip obturator was introduced to the abdomen without difficulty with careful elevation of the anterior abdominal wall. The obturator was removed. Pneumoperitoneum was initiated at high flow and pressure of 15mm Hg. The 5mm scope was then inserted to the port for inspection of the abdomen with findings noted above.  Next, two additional ports were placed, one in the LLQ and one lateral to the  umbilical port. The skin was anesthetized in a similar fashion as above. A small skin incision was made with the scalpel bilaterally. Two 5 mm ports were then placed under direct visualization without difficulty.  The patient was placed in Trendelenburg. The ureter was identified at a safe distance from the operative field. The left tube and ovary were stabilized using a blunt grasper and a Ligasure was used to ligate the IP ligament. The utero-ovarian ligament was also ligated and transected with the Ligasure. The operative site was examined and found to be hemostatic.  The same procedure was carried out on the contralateral side, although right fallopian tube absent. The large cyst was drained using a syringe intraabdominally. Some spillage of serous cyst fluid noted. Both specimens were placed in a laparoscopic bag. The LLQ port skin was extended and the specimens were removed intact.  The abdomen and pelvis were thoroughly inspected. Good hemostasis was appreciated throughout. All instruments were removed from the abdomen. The pneumoperitoneum was released and correct instrument counts were confirmed. The LLQ fascia was closed with 0-Vicryl. Skin incisions were closed with 4-0 Vicryl. Attention was turned to the vagina - the foley catheter was removed. The patient tolerated the procedure well. She was taken to the recovery room in stable condition. The patient will be discharged from the PACU after all criteria are met.    Slater Door, MD

## 2024-01-22 NOTE — Transfer of Care (Signed)
 Immediate Anesthesia Transfer of Care Note  Patient: Gloria Acosta  Procedure(s) Performed: EXCISION, CYST, OVARY, LAPAROSCOPIC (Right: Pelvis) OOPHORECTOMY, LAPAROSCOPIC (Bilateral: Pelvis) SALPINGECTOMY, UNILATERAL, LAPAROSCOPIC (Left: Pelvis)  Patient Location: PACU  Anesthesia Type:General  Level of Consciousness: awake  Airway & Oxygen Therapy: Patient Spontanous Breathing  Post-op Assessment: Report given to RN and Post -op Vital signs reviewed and stable  Post vital signs: Reviewed and stable  Last Vitals:  Vitals Value Taken Time  BP 85/50 01/22/24 09:46  Temp 36.4 C 01/22/24 09:33  Pulse 64 01/22/24 09:49  Resp 9 01/22/24 09:49  SpO2 94 % 01/22/24 09:49  Vitals shown include unfiled device data.  Last Pain:  Vitals:   01/22/24 0942  TempSrc:   PainSc: 5       Patients Stated Pain Goal: 2 (01/22/24 0636)  Complications: No notable events documented.

## 2024-01-22 NOTE — Anesthesia Postprocedure Evaluation (Signed)
 Anesthesia Post Note  Patient: Gloria Acosta  Procedure(s) Performed: EXCISION, CYST, OVARY, LAPAROSCOPIC (Right: Pelvis) OOPHORECTOMY, LAPAROSCOPIC (Bilateral: Pelvis) SALPINGECTOMY, UNILATERAL, LAPAROSCOPIC (Left: Pelvis)     Patient location during evaluation: PACU Anesthesia Type: General Level of consciousness: awake and alert Pain management: pain level controlled Vital Signs Assessment: post-procedure vital signs reviewed and stable Respiratory status: spontaneous breathing, nonlabored ventilation and respiratory function stable Cardiovascular status: stable and blood pressure returned to baseline Anesthetic complications: no   No notable events documented.  Last Vitals:  Vitals:   01/22/24 1015 01/22/24 1030  BP: (!) 95/58 (!) 99/53  Pulse: 72 66  Resp: 19 14  Temp:  36.4 C  SpO2: 97% 99%    Last Pain:  Vitals:   01/22/24 1029  TempSrc:   PainSc: 3                  [name] Like

## 2024-01-23 ENCOUNTER — Encounter (HOSPITAL_COMMUNITY): Payer: Self-pay | Admitting: Obstetrics and Gynecology

## 2024-01-29 LAB — SURGICAL PATHOLOGY
